# Patient Record
Sex: Male | Born: 1995 | Race: White | Hispanic: No | Smoking: Never smoker
Health system: Southern US, Community
[De-identification: ages and names within clinical notes are randomized; demographics above are authoritative.]

## PROBLEM LIST (undated history)

## (undated) DIAGNOSIS — F909 Attention-deficit hyperactivity disorder, unspecified type: Secondary | ICD-10-CM

## (undated) DIAGNOSIS — K219 Gastro-esophageal reflux disease without esophagitis: Secondary | ICD-10-CM

## (undated) DIAGNOSIS — F840 Autistic disorder: Secondary | ICD-10-CM

## (undated) DIAGNOSIS — F845 Asperger's syndrome: Secondary | ICD-10-CM

## (undated) DIAGNOSIS — M51369 Other intervertebral disc degeneration, lumbar region without mention of lumbar back pain or lower extremity pain: Secondary | ICD-10-CM

## (undated) DIAGNOSIS — F419 Anxiety disorder, unspecified: Secondary | ICD-10-CM

## (undated) DIAGNOSIS — S6292XA Unspecified fracture of left wrist and hand, initial encounter for closed fracture: Secondary | ICD-10-CM

## (undated) DIAGNOSIS — F0781 Postconcussional syndrome: Secondary | ICD-10-CM

## (undated) HISTORY — DX: Gastro-esophageal reflux disease without esophagitis: K21.9

## (undated) HISTORY — DX: Postconcussional syndrome: F07.81

## (undated) HISTORY — DX: Anxiety disorder, unspecified: F41.9

## (undated) HISTORY — DX: Unspecified fracture of left wrist and hand, initial encounter for closed fracture: S62.92XA

## (undated) HISTORY — DX: Autistic disorder: F84.0

## (undated) HISTORY — DX: Attention-deficit hyperactivity disorder, unspecified type: F90.9

## (undated) HISTORY — PX: FRACTURE SURGERY: SHX138

## (undated) HISTORY — DX: Other intervertebral disc degeneration, lumbar region without mention of lumbar back pain or lower extremity pain: M51.369

---
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
## 2017-01-01 ENCOUNTER — Emergency Department (HOSPITAL_BASED_OUTPATIENT_CLINIC_OR_DEPARTMENT_OTHER)
Admission: EM | Admit: 2017-01-01 | Discharge: 2017-01-01 | Disposition: A | Discharge status: Home / Self Care | Payer: Medicaid Other | Attending: Emergency Medicine | Admitting: Emergency Medicine

## 2017-01-01 ENCOUNTER — Encounter (HOSPITAL_BASED_OUTPATIENT_CLINIC_OR_DEPARTMENT_OTHER): Payer: Self-pay | Admitting: Emergency Medicine

## 2017-01-01 DIAGNOSIS — Z5189 Encounter for other specified aftercare: Secondary | ICD-10-CM

## 2017-01-01 DIAGNOSIS — Z4800 Encounter for change or removal of nonsurgical wound dressing: Secondary | ICD-10-CM | POA: Diagnosis not present

## 2017-01-01 DIAGNOSIS — T148XXA Other injury of unspecified body region, initial encounter: Secondary | ICD-10-CM

## 2017-01-01 DIAGNOSIS — Y69 Unspecified misadventure during surgical and medical care: Secondary | ICD-10-CM | POA: Insufficient documentation

## 2017-01-01 DIAGNOSIS — F909 Attention-deficit hyperactivity disorder, unspecified type: Secondary | ICD-10-CM | POA: Diagnosis not present

## 2017-01-01 DIAGNOSIS — T8189XA Other complications of procedures, not elsewhere classified, initial encounter: Secondary | ICD-10-CM | POA: Insufficient documentation

## 2017-01-01 MED ORDER — "THROMBI-PAD 3""X3"" EX PADS"
1.0000 | MEDICATED_PAD | Freq: Once | CUTANEOUS | Status: AC
  Administered 2017-01-01: 1 via TOPICAL
  Filled 2017-01-01: qty 1

## 2017-01-01 NOTE — ED Provider Notes (Signed)
[facility] Provider Note: Michael Spurling, MD, FACEP  CSN: [id number] MRN: [id number] ARRIVAL: 01/01/17 at 2236 ROOM: MH03/MH03  By signing my name below, I, Soijett Blue, attest that this documentation has been prepared under the direction and in the presence of Shanon Rosser, MD. Electronically Signed: Soijett Blue, ED Scribe. 01/01/17. 11:30 PM.  CHIEF COMPLAINT  Wound Check   HISTORY OF PRESENT ILLNESS  Michael Esparza is a 21 y.o. male who presents to the Emergency Department complaining of wound check. He suffered a puncture wound to the medial right knee 2 days ago. He states that he was climbing a tree at his house with spikes when a spike slipped and went approximately 1 inch into his right medial knee.   Mother reports that the pt was seen at [facility] following the incident and prescribed naproxyn and Bactrim. The wound was irrigated but not sutured. Tetanus was updated. He comes to the ED denied because the wound continues to ooze blood despite bandaging. There is moderate tenderness associated with the wound but no erythema or drainage. The right knee joint itself is without pain.   Past Medical History:  Diagnosis Date  . ADHD (attention deficit hyperactivity disorder)   . Asperger's syndrome   . Fracture of left hand 2014   laceration, hedge clipping  . Post concussion syndrome 07/21/2014    Past Surgical History:  Procedure Laterality Date  . OPEN REDUCTION INTERNAL FIXATION (ORIF) HAND Left 12/21/2015   Procedure: OPEN REDUCTION INTERNAL FIXATION (ORIF) HAND, NERVE AND TENDON REPAIR;  Surgeon: Milly Jakob, MD;  Location: [facility];  Service: Orthopedics;  Laterality: Left;    Family History  Problem Relation Age of Onset  . Hypertension Mother   . Arthritis Father 78    anklosing spondylosis    Social History  Substance Use Topics  . Smoking status: Never Smoker  . Smokeless tobacco: Never Used     Comment: dad smokes inside at his house  . Alcohol  use No    Prior to Admission medications   Medication Sig Start Date End Date Taking? Authorizing Provider  ADDERALL XR 25 MG 24 hr capsule Take 2 in mornin g( 50 mg) and one about 3 pm (25 mg) 12/15/16 01/14/17  Roselind Messier, MD  ADDERALL XR 25 MG 24 hr capsule Take 2 cap in morning (50 mg) and one (25 mg ) at 3 pm. 01/15/17 02/14/17  Roselind Messier, MD  ADDERALL XR 25 MG 24 hr capsule Take 2 cap in morning (50 mg) and one (25 mg ) at 3 pm. 02/14/17 03/17/17  Roselind Messier, MD  BENZACLIN gel Apply topically 2 (two) times daily. 12/15/16 05/17/17  Roselind Messier, MD  loratadine (CLARITIN REDITABS) 10 MG dissolvable tablet Take 1 tablet (10 mg total) by mouth daily. As needed for allergy symptoms 12/15/16   Roselind Messier, MD  sodium chloride (OCEAN) 0.65 % SOLN nasal spray Place 1 spray into both nostrils 2 (two) times daily. Patient not taking: Reported on 12/15/2016 12/06/16   Elsie Lincoln, NP    Allergies Patient has no known allergies.   REVIEW OF SYSTEMS  Negative except as noted here or in the History of Present Illness.   PHYSICAL EXAMINATION  Initial Vital Signs Blood pressure 139/85, pulse 80, temperature 98.1 F (36.7 C), temperature source Oral, resp. rate 17, height 5\' 10"  (1.778 m), weight 177 lb (80.3 kg), SpO2 100 %.  Examination General: Well-developed, well-nourished male in no acute distress; appearance consistent with  age of record HENT: normocephalic; atraumatic Eyes: pupils equal, round and reactive to light; extraocular muscles intact Neck: supple Heart: regular rate and rhythm Lungs: clear to auscultation bilaterally Abdomen: soft; nondistended; nontender; no masses or hepatosplenomegaly; bowel sounds present Extremities: No deformity; full range of motion; pulses normal; puncture wound to medial right knee with oozing of blood but no surrounding erythema or purulent drainage; no tenderness, erythema, warmth or swelling of right knee joint;  surgical changes to left thumb Neurologic: Awake, alert and oriented; motor function intact in all extremities and symmetric; no facial droop Skin: Warm and dry Psychiatric: Normal mood and affect   RESULTS  Summary of this visit's results, reviewed by myself:   EKG Interpretation  Date/Time:    Ventricular Rate:    PR Interval:    QRS Duration:   QT Interval:    QTC Calculation:   R Axis:     Text Interpretation:        Laboratory Studies: No results found for this or any previous visit (from the past 24 hour(s)). Imaging Studies: No results found.  ED COURSE  Nursing notes and initial vitals signs, including pulse oximetry, reviewed.  Vitals:   01/01/17 2248 01/01/17 2249  BP: 139/85   Pulse: 80   Resp: 17   Temp: 98.1 F (36.7 C)   TempSrc: Oral   SpO2: 100%   Weight:  177 lb (80.3 kg)  Height:  5\' 10"  (1.778 m)   Will treat with topical thrombin and pressure dressing. Patient advised to keep leg elevated.  PROCEDURES    ED DIAGNOSES     ICD-9-CM ICD-10-CM   1. Encounter for wound re-check V58.89 Z51.89   2. Bleeding from wound 958.2 T14.8XXA     I personally performed the services described in this documentation, which was scribed in my presence. The recorded information has been reviewed and is accurate.     Shanon Rosser, MD 01/01/17 [id number]

## 2017-01-01 NOTE — ED Triage Notes (Signed)
PT presents to ED with c/o of right leg wound check. PT stst he was climbing a tree Friday and one of the spikes slipped an stuck him. Pt went to an urgent care but they did not suture it and pt sts it continues to bleed. Pressure applied in triage.

## 2017-01-30 IMAGING — CR DG HAND COMPLETE 3+V*L*
3 series · 3 of 3 positions shown · non-contrast
Comparison: 06/07/2012.

CLINICAL DATA: Open wound of the left thumb with deformity noted
falling injury today. Initial encounter.

EXAM:
LEFT HAND - COMPLETE 3+ VIEW

[hand pa]
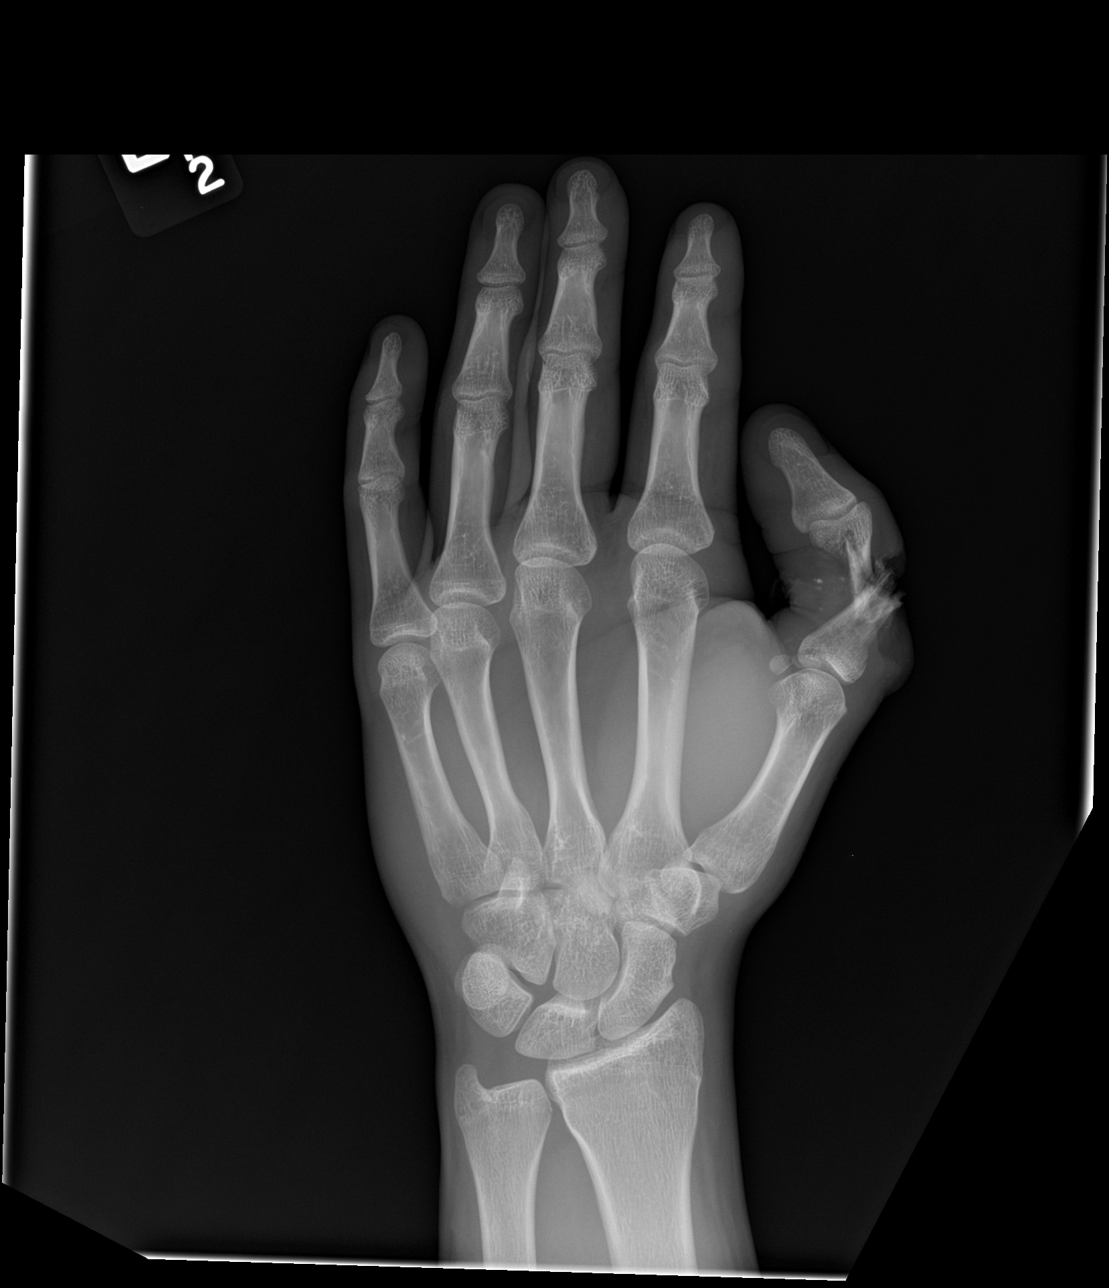

[hand obl]
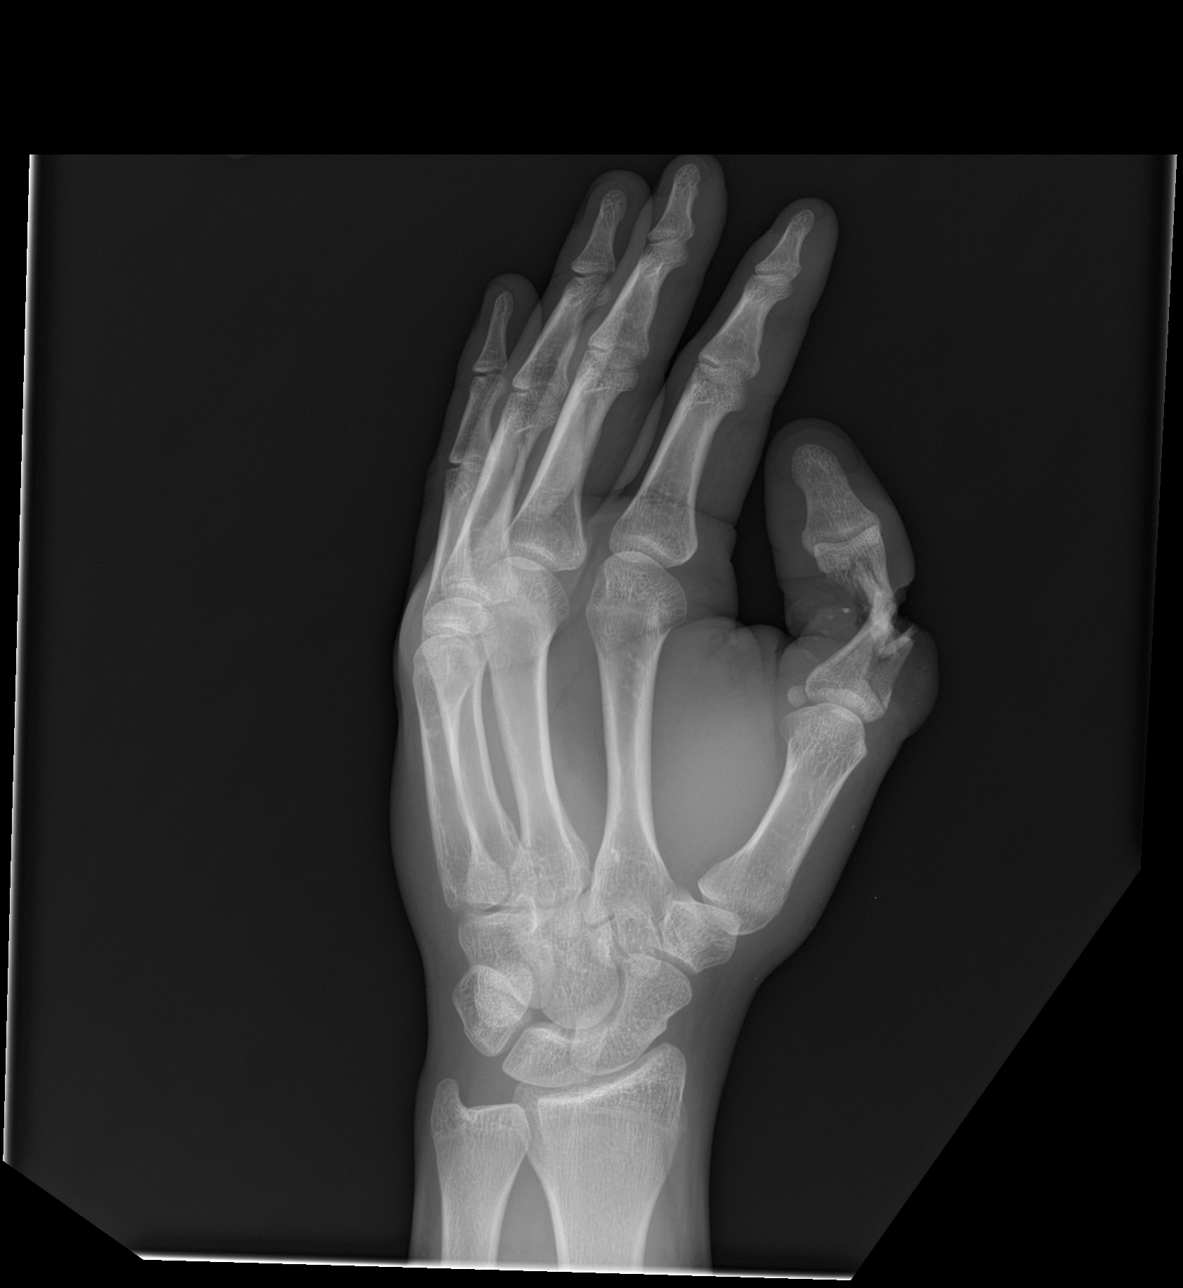

[hand lat]
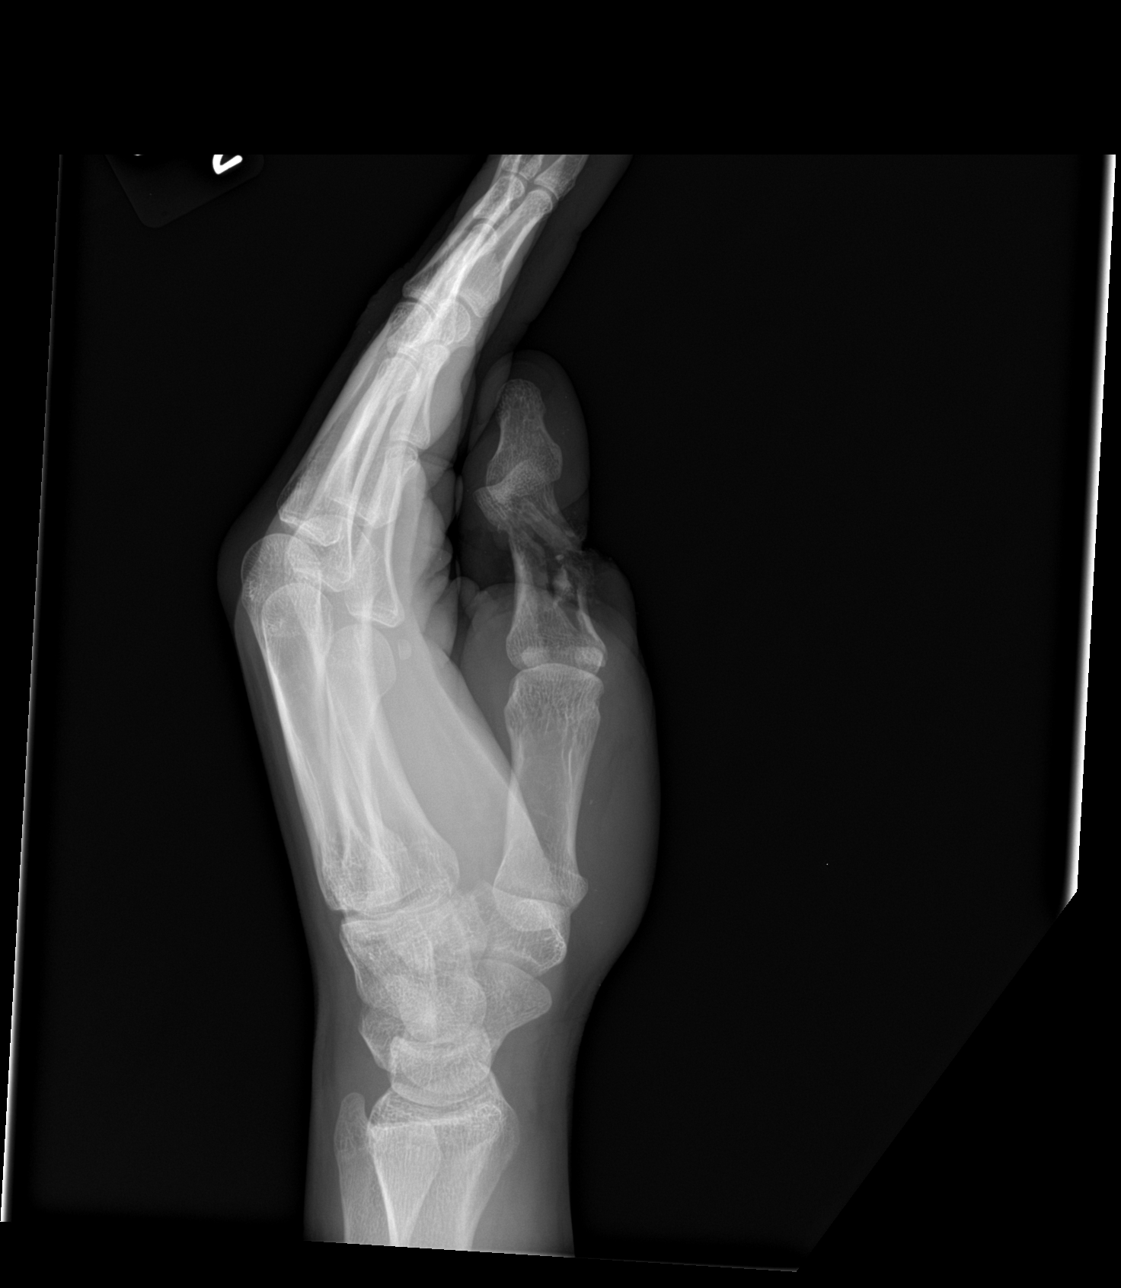

[3 of 3 positions shown; findings below may reference images not displayed]

FINDINGS: There is an acute, extensively comminuted and open fracture of the
proximal phalanx of the left thumb. This fracture is displaced and
angulated, but shows no definite extension to the metacarpal
phalangeal or interphalangeal joints. There are probable associated
foreign bodies within the surrounding soft tissues. No other acute
osseous findings are seen.
IMPRESSION: Extensively comminuted, open and displaced fracture of the left
first proximal phalanx.

## 2017-01-31 IMAGING — RF DG FINGER THUMB 2+V*L*
1 series · 2 of 2 positions shown · non-contrast
Comparison: 12/21/2015

CLINICAL DATA: Left thumb ORIF.

EXAM:
DG C-ARM 61-120 MIN; LEFT THUMB 2+V

[Series 1: run · 2 of 2 slices shown]
[im 1/2]
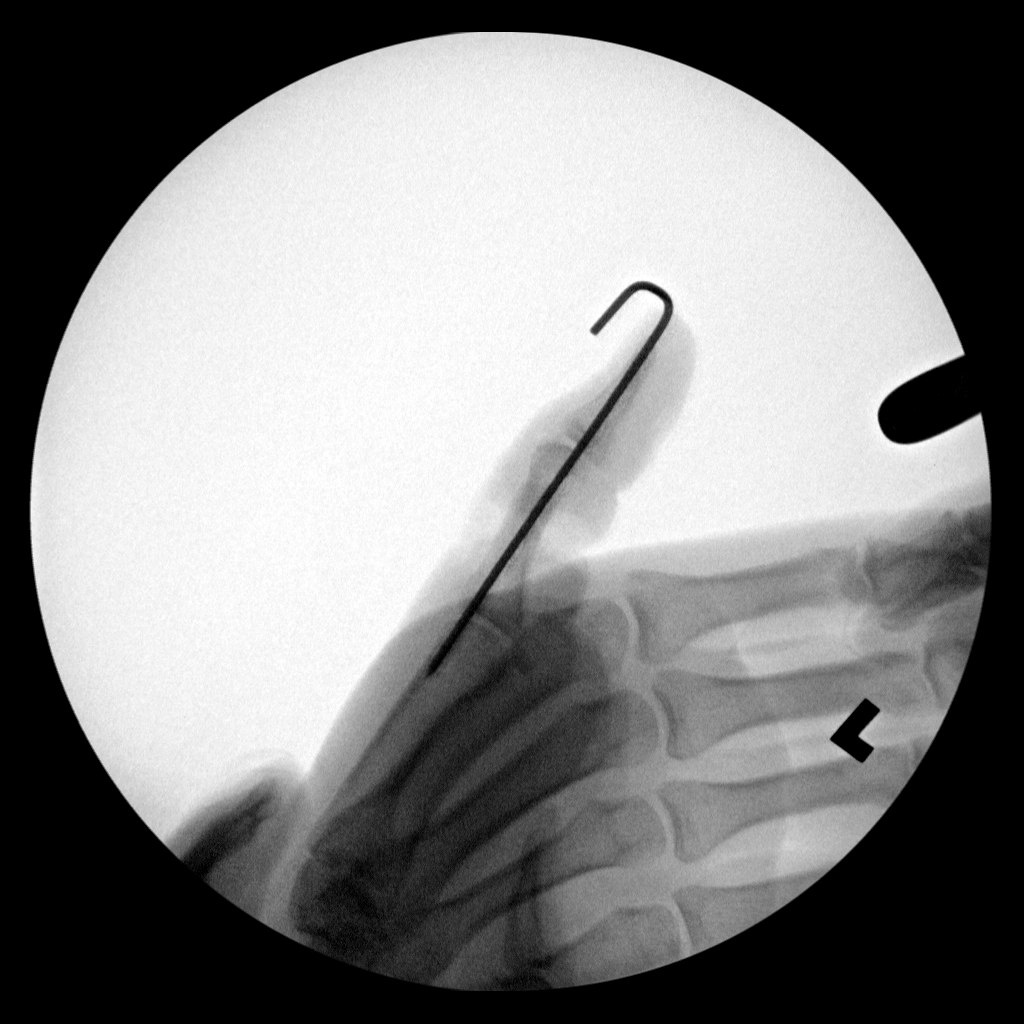
[im 2/2]
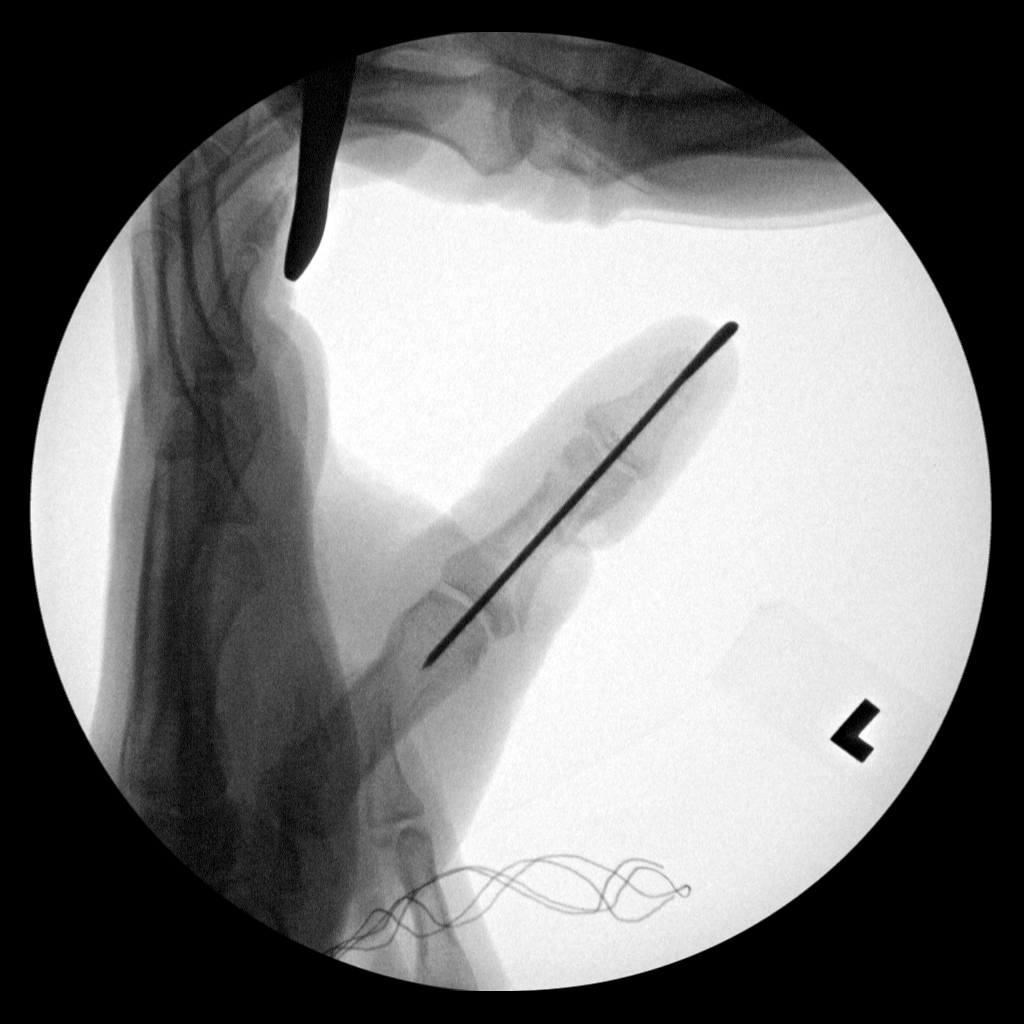

[2 of 2 positions shown; findings below may reference images not displayed]

FINDINGS: Intraoperative fluoroscopy is obtained for surgical control
purposes. Fluoroscopy time is recorded at 16 seconds. Two spot
fluoroscopic images are obtained.

Spot fluoroscopic images obtained demonstrate interval reduction and
K-wire fixation of comminuted and displaced fractures of the
proximal phalanx of the left first finger seen on prior study.
Fracture fragments demonstrate near-anatomic alignment and position.
IMPRESSION: Intraoperative fluoroscopy obtained for surgical control purposes,
demonstrating internal reduction and K-wire fixation of fractures of
the proximal phalanx of the left first finger.

## 2017-03-16 ENCOUNTER — Ambulatory Visit (INDEPENDENT_AMBULATORY_CARE_PROVIDER_SITE_OTHER): Payer: Medicaid Other | Admitting: Pediatrics

## 2017-03-16 ENCOUNTER — Encounter: Payer: Self-pay | Admitting: Pediatrics

## 2017-03-16 VITALS — BP 120/74 | HR 64 | Ht 69.29 in | Wt 177.6 lb

## 2017-03-16 DIAGNOSIS — F902 Attention-deficit hyperactivity disorder, combined type: Secondary | ICD-10-CM | POA: Diagnosis not present

## 2017-03-16 DIAGNOSIS — J301 Allergic rhinitis due to pollen: Secondary | ICD-10-CM

## 2017-03-16 MED ORDER — FLUTICASONE PROPIONATE 50 MCG/ACT NA SUSP: 1.0000 | Freq: Every day | NASAL | 5 refills | Status: DC

## 2017-03-16 MED ORDER — ADDERALL XR 25 MG PO CP24: ORAL_CAPSULE | ORAL | 0 refills | Status: DC

## 2017-03-16 MED ORDER — CETIRIZINE HCL 10 MG PO TABS: 10.0000 mg | ORAL_TABLET | Freq: Every day | ORAL | 5 refills | Status: DC

## 2017-03-16 MED ORDER — MONTELUKAST SODIUM 10 MG PO TABS: 10.0000 mg | ORAL_TABLET | Freq: Every day | ORAL | 5 refills | Status: DC

## 2017-03-16 NOTE — Patient Instructions (Signed)
Flonase works best if used every day  Please try Cetirizine instead of Claritin  Please avoid further injuries this summer!  Websites for Teens  General www.youngwomenshealth.org www.youngmenshealthsite.org www.teenhealthfx.com www.teenhealth.org www.healthychildren.org  Sexual and Reproductive Health www.bedsider.org www.seventeendays.org www.plannedparenthood.org www.https://www.marshall.com/ www.girlology.com  Relaxation & Meditation Apps for Teens Mindshift StopBreatheThink Relax & Rest Smiling Mind Calm Headspace Take A Chill Kids Feeling SAM Freshmind Yoga [employer]  Websites for kids with ADHD and their families www.smartkidswithld.org www.additudemag.com  Apps for Parents of [place]

## 2017-03-16 NOTE — Progress Notes (Signed)
Subjective:     Michael Esparza, is a 21 y.o. male  HPI  Chief Complaint  Patient presents with  . ADHD  . 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 They are cauticious about loose pill Adderall xr 25 mg 2 in am and 1 about 3 pm,   ROS/ Side effects? No change in appetite, No change in sleep No Headache or dizziness No chest pain or palpitations No nausea, vomiting or abd pain No change in mood, no elation, no wearing off dysphoria   Review of Systems  Going to beach today   The following portions of the patient's history were reviewed and updated as appropriate: allergies, current medications, past family history, past medical history, past surgical history and problem list.     Objective:     Blood pressure 120/74, pulse 64, height 5' 9.29" (1.76 m), weight 177 lb 9.6 oz (80.6 kg).  Physical Exam  Constitutional: He appears well-developed and well-nourished. No distress.  HENT:  Head: Normocephalic and atraumatic.  Nose: Nose normal.  Mouth/Throat: Oropharynx is clear and moist.  Eyes: Conjunctivae and EOM are normal. Right eye exhibits no discharge. Left eye exhibits no discharge.  Neck: Normal range of motion. No thyromegaly present.  Cardiovascular: Normal rate, regular rhythm and normal heart sounds.   No murmur heard. Pulmonary/Chest: No respiratory distress. He has no wheezes. He has no rales.  Abdominal: Soft. He exhibits no distension. There is no tenderness.    Lymphadenopathy:    He has no cervical adenopathy.  Skin: Skin is warm and dry. No rash noted.       Assessment & Plan:    Add singulair, failure to resolve sym after 2 month of Claritin 1. Seasonal allergic rhinitis due to pollen Poor control, inadequestely treated - fluticasone (FLONASE) 50 MCG/ACT nasal spray; Place 1 spray into both nostrils daily. 1 spray in each nostril every day  Dispense: 16 g; Refill: 5 - cetirizine (ZYRTEC) 10 MG tablet; Take 1 tablet (10 mg total) by mouth daily.  Dispense: 30 tablet; Refill: 5 - montelukast (SINGULAIR) 10 MG tablet; Take 1 tablet (10 mg total) by mouth at bedtime.  Dispense: 30 tablet; Refill: 5  Add singulair do to lask of response for 2 months of claritin  2. Attention deficit hyperactivity disorder (ADHD), combined type  Doing well on current dose No side effects noted - ADDERALL XR 25 MG 24 hr capsule; Take 2 cap in morning (50 mg) and one (25 mg ) at 3 pm.  Dispense: 90 capsule; Refill: 0 - ADDERALL XR 25 MG 24 hr capsule; Take 2 in mornin g( 50 mg) and one about 3 pm (25 mg)  Dispense: 90 capsule; Refill: 0 - ADDERALL XR 25 MG 24 hr capsule; Take 2 cap in morning (50 mg) and one (25 mg ) at 3 pm.  Dispense: 90 capsule; Refill: 0  Supportive care and return precautions reviewed.  Spent  25  minutes  face to face time with patient; greater than 50% spent in counseling regarding diagnosis and treatment plan.   Roselind Messier, MD

## 2017-04-06 ENCOUNTER — Telehealth: Payer: Self-pay | Admitting: *Deleted

## 2017-04-06 NOTE — Telephone Encounter (Signed)
Mom called back and she has the prescription. [facility] as the brand name.

## 2017-04-06 NOTE — Telephone Encounter (Signed)
Received Rejection Message from pharmacy regarding adderall XR stating QTY exceeds the adult dosage by the FDA for ADD/ADHD meds.  Called the insurer [employer] for approval and was told that the pharmacy submitted request for generic when brand name is preferred and pharmacy should resubmit.  Per Help Desk [phone]) the quantity could be overridden.    [facility] and [facility] stated patient must have taken written Rx with him because they could not process it.  Called mom's number and left a message on generic voicemail to call the office.

## 2017-06-15 ENCOUNTER — Encounter (HOSPITAL_BASED_OUTPATIENT_CLINIC_OR_DEPARTMENT_OTHER): Payer: Self-pay

## 2017-06-15 ENCOUNTER — Emergency Department (HOSPITAL_BASED_OUTPATIENT_CLINIC_OR_DEPARTMENT_OTHER)
Admission: EM | Admit: 2017-06-15 | Discharge: 2017-06-15 | Disposition: A | Discharge status: Home / Self Care | Payer: Medicaid Other | Attending: Emergency Medicine | Admitting: Emergency Medicine

## 2017-06-15 ENCOUNTER — Emergency Department (HOSPITAL_BASED_OUTPATIENT_CLINIC_OR_DEPARTMENT_OTHER): Payer: Medicaid Other

## 2017-06-15 ENCOUNTER — Ambulatory Visit: Payer: Medicaid Other | Admitting: Pediatrics

## 2017-06-15 DIAGNOSIS — Y939 Activity, unspecified: Secondary | ICD-10-CM | POA: Diagnosis not present

## 2017-06-15 DIAGNOSIS — Y929 Unspecified place or not applicable: Secondary | ICD-10-CM | POA: Diagnosis not present

## 2017-06-15 DIAGNOSIS — F845 Asperger's syndrome: Secondary | ICD-10-CM | POA: Insufficient documentation

## 2017-06-15 DIAGNOSIS — S61412A Laceration without foreign body of left hand, initial encounter: Secondary | ICD-10-CM | POA: Insufficient documentation

## 2017-06-15 DIAGNOSIS — W14XXXA Fall from tree, initial encounter: Secondary | ICD-10-CM | POA: Diagnosis not present

## 2017-06-15 DIAGNOSIS — W19XXXA Unspecified fall, initial encounter: Secondary | ICD-10-CM

## 2017-06-15 DIAGNOSIS — Y999 Unspecified external cause status: Secondary | ICD-10-CM | POA: Insufficient documentation

## 2017-06-15 DIAGNOSIS — Z79899 Other long term (current) drug therapy: Secondary | ICD-10-CM | POA: Diagnosis not present

## 2017-06-15 DIAGNOSIS — S6992XA Unspecified injury of left wrist, hand and finger(s), initial encounter: Secondary | ICD-10-CM | POA: Diagnosis present

## 2017-06-15 MED ORDER — LIDOCAINE HCL (PF) 1 % IJ SOLN: 5.0000 mL | Freq: Once | INTRAMUSCULAR | Status: DC

## 2017-06-15 MED ORDER — CEPHALEXIN 500 MG PO CAPS: 500.0000 mg | ORAL_CAPSULE | Freq: Two times a day (BID) | ORAL | 0 refills | Status: AC

## 2017-06-15 MED ORDER — ACETAMINOPHEN 500 MG PO TABS
1000.0000 mg | ORAL_TABLET | Freq: Once | ORAL | Status: AC
  Administered 2017-06-15: 1000 mg via ORAL
  Filled 2017-06-15: qty 2

## 2017-06-15 MED ORDER — LIDOCAINE HCL 1 % IJ SOLN
INTRAMUSCULAR | Status: AC
  Administered 2017-06-15: 10 mL
  Filled 2017-06-15: qty 10

## 2017-06-15 NOTE — ED Notes (Signed)
Patient transported to X-ray 

## 2017-06-15 NOTE — ED Triage Notes (Signed)
Pt states he was in a top of tree-piece snapped and he fell through the tree-was strapped in-multiple abrasions-lac to left hand-pain to left LE-NAD-steady gait

## 2017-06-15 NOTE — ED Notes (Signed)
Nonadhereant dressing applied to wound, wrapped in Kerlix and secured with Coban

## 2017-06-15 NOTE — ED Provider Notes (Signed)
[facility] Provider Note   CSN: [id number] Arrival date & time: 06/15/17  1656     History   Chief Complaint Chief Complaint  Patient presents with  . Trauma    HPI Michael Esparza is a 21 y.o. male.  HPI   Presents with concern for laceration to left hand after falling while cutting down branches off of a tree. Was sitting in harness 52ft off the ground in a maple tree when the top broke and he feel 26ft through the branches before his harness caught him> He lowered himself down to the ground, able to ambulate at the scene. Mild headache, it happened so quickly not sure if hit head ornot. No LOC.   Saw was on top of hand, had was between branch, thinks saw hit it and then hand stuck.  No chest pain, no abdominal pain, no nausea or vomiting. Mild lightheadedness on way here. No neck or back pain. UTD on tetanus  Past Medical History:  Diagnosis Date  . ADHD (attention deficit hyperactivity disorder)   . Asperger's syndrome   . Fracture of left hand 2014   laceration, hedge clipping  . Post concussion syndrome 07/21/2014    Patient Active Problem List   Diagnosis Date Noted  . Seasonal allergic rhinitis due to pollen 03/16/2017  . Overweight, pediatric, BMI 85.0-94.9 percentile for age 75/22/2017  . Thumb fracture 12/22/2015  . Acne 07/23/2015  . Influenza vaccination declined 07/11/2014  . Attention deficit hyperactivity disorder (ADHD), combined type 06/12/2014  . Asperger syndrome 06/12/2014    Past Surgical History:  Procedure Laterality Date  . OPEN REDUCTION INTERNAL FIXATION (ORIF) HAND Left 12/21/2015   Procedure: OPEN REDUCTION INTERNAL FIXATION (ORIF) HAND, NERVE AND TENDON REPAIR;  Surgeon: Milly Jakob, MD;  Location: [facility];  Service: Orthopedics;  Laterality: Left;       Home Medications    Prior to Admission medications   Medication Sig Start Date End Date Taking? Authorizing Provider  ADDERALL XR 25 MG 24 hr capsule Take 2 cap in  morning (50 mg) and one (25 mg ) at 3 pm. 03/16/17 04/16/17  Roselind Messier, MD  ADDERALL XR 25 MG 24 hr capsule Take 2 in mornin g( 50 mg) and one about 3 pm (25 mg) 04/15/17 05/16/17  Roselind Messier, MD  ADDERALL XR 25 MG 24 hr capsule Take 2 cap in morning (50 mg) and one (25 mg ) at 3 pm. 05/16/17 06/16/17  Roselind Messier, MD  cephALEXin (KEFLEX) 500 MG capsule Take 1 capsule (500 mg total) by mouth 2 (two) times daily. 06/15/17 06/22/17  Gareth Morgan, MD  cetirizine (ZYRTEC) 10 MG tablet Take 1 tablet (10 mg total) by mouth daily. 03/16/17   Roselind Messier, MD  fluticasone (FLONASE) 50 MCG/ACT nasal spray Place 1 spray into both nostrils daily. 1 spray in each nostril every day 03/16/17   Roselind Messier, MD  montelukast (SINGULAIR) 10 MG tablet Take 1 tablet (10 mg total) by mouth at bedtime. 03/16/17   Roselind Messier, MD    Family History Family History  Problem Relation Age of Onset  . Hypertension Mother   . Arthritis Father 35       anklosing spondylosis    Social History Social History  Substance Use Topics  . Smoking status: Never Smoker  . Smokeless tobacco: Never Used  . Alcohol use No     Allergies   Patient has no known allergies.   Review of Systems Review of Systems  Constitutional: Negative for fever.  HENT: Negative for sore throat.   Eyes: Negative for visual disturbance.  Respiratory: Negative for shortness of breath.   Cardiovascular: Negative for chest pain.  Gastrointestinal: Negative for abdominal pain, nausea and vomiting.  Genitourinary: Negative for difficulty urinating.  Musculoskeletal: Positive for arthralgias. Negative for back pain and neck stiffness.  Skin: Positive for wound. Negative for rash.  Neurological: Negative for syncope and headaches.     Physical Exam Updated Vital Signs BP 134/84 (BP Location: Left Arm)   Pulse 95   Temp 98.9 F (37.2 C) (Oral)   Resp 18   Ht 5\' 9"  (1.753 m)   Wt 84.3 kg (185 lb 13.6 oz)    SpO2 98%   BMI 27.44 kg/m   Physical Exam  Constitutional: He is oriented to person, place, and time. He appears well-developed and well-nourished. No distress.  HENT:  Head: Normocephalic and atraumatic.  Eyes: Conjunctivae and EOM are normal.  Neck: Normal range of motion.  Cardiovascular: Normal rate, regular rhythm, normal heart sounds and intact distal pulses.  Exam reveals no gallop and no friction rub.   No murmur heard. Pulmonary/Chest: Effort normal and breath sounds normal. No respiratory distress. He has no wheezes. He has no rales.  Abdominal: Soft. He exhibits no distension. There is no tenderness. There is no guarding.  Musculoskeletal: He exhibits no edema.  Full ROM of fingers, normal cap refill  Neurological: He is alert and oriented to person, place, and time.  Skin: Skin is warm and dry. He is not diaphoretic.  Multiple abrasions Laceration dorsum of left hand MCP base of ring finger 3cm  Nursing note and vitals reviewed.    ED Treatments / Results  Labs (all labs ordered are listed, but only abnormal results are displayed) Labs Reviewed - No data to display  EKG  EKG Interpretation None       Radiology No results found.  Procedures .Marland KitchenLaceration Repair Date/Time: 06/18/2017 12:52 AM Performed by: Gareth Morgan Authorized by: Gareth Morgan   Consent:    Consent obtained:  Verbal   Consent given by:  Patient   Risks discussed:  Infection, pain and poor wound healing   Alternatives discussed:  No treatment Laceration details:    Location:  Hand   Hand location:  L hand, dorsum   Length (cm):  2 Repair type:    Repair type:  Simple Pre-procedure details:    Preparation:  Imaging obtained to evaluate for foreign bodies and patient was prepped and draped in usual sterile fashion Treatment:    Area cleansed with:  Saline   Amount of cleaning:  Standard   Irrigation solution:  Sterile saline   Irrigation method:  Syringe Skin repair:     Repair method:  Sutures   Suture size:  4-0   Suture material:  Prolene   Suture technique:  Simple interrupted   Number of sutures:  4 Post-procedure details:    Dressing:  Antibiotic ointment, non-adherent dressing and splint for protection   Patient tolerance of procedure:  Tolerated well, no immediate complications   (including critical care time)  Medications Ordered in ED Medications  acetaminophen (TYLENOL) tablet 1,000 mg (1,000 mg Oral Given 06/15/17 1752)  lidocaine (XYLOCAINE) 1 % (with pres) injection (10 mLs  Given by Other 06/15/17 1910)     Initial Impression / Assessment and Plan / ED Course  I have reviewed the triage vital signs and the nursing notes.  Pertinent labs & imaging results  that were available during my care of the patient were reviewed by me and considered in my medical decision making (see chart for details).     21 year old male presents with concern for left hand laceration after he fell in a tree while cutting branches. Patient was in harness and fell approximately 10 feet through the branches before the harness caught his fall. He denies any headache, has no signs of skull fracture on exam, no nausea vomiting, have low suspicion for intracranial injury. His cervical spine is cleared by Nexus criteria. Doubt other acute injuries by hx and physical. He is up-to-date on tetanus. He has a laceration, is neurovascularly intact, and has full range of motion of his finger. Laceration irrigated, sutures placed. Recommend removal in 10 days. Given keflex for wound infxn prophylaxis. Patient discharged in stable condition with understanding of reasons to return.    Final Clinical Impressions(s) / ED Diagnoses   Final diagnoses:  Fall, initial encounter  Laceration of left hand without foreign body, initial encounter    New Prescriptions Discharge Medication List as of 06/15/2017  7:20 PM    START taking these medications   Details  cephALEXin (KEFLEX) 500  MG capsule Take 1 capsule (500 mg total) by mouth 2 (two) times daily., Starting Thu 06/15/2017, Until Thu 06/22/2017, Print         Gareth Morgan, MD 06/18/17 [id number]

## 2017-07-07 ENCOUNTER — Ambulatory Visit (INDEPENDENT_AMBULATORY_CARE_PROVIDER_SITE_OTHER): Payer: Medicaid Other | Admitting: Pediatrics

## 2017-07-07 ENCOUNTER — Encounter: Payer: Self-pay | Admitting: Pediatrics

## 2017-07-07 VITALS — BP 125/78 | HR 66 | Ht 69.25 in | Wt 179.0 lb

## 2017-07-07 DIAGNOSIS — F902 Attention-deficit hyperactivity disorder, combined type: Secondary | ICD-10-CM | POA: Diagnosis not present

## 2017-07-07 DIAGNOSIS — Z2821 Immunization not carried out because of patient refusal: Secondary | ICD-10-CM

## 2017-07-07 MED ORDER — ADDERALL XR 25 MG PO CP24: ORAL_CAPSULE | ORAL | 0 refills | Status: DC

## 2017-07-07 MED ORDER — ADDERALL XR 25 MG PO CP24
ORAL_CAPSULE | ORAL | 0 refills | Status: DC
Start: 2017-07-07 — End: 2017-10-10

## 2017-07-07 NOTE — Progress Notes (Signed)
   Subjective:     Michael Esparza, is a 21 y.o. male  HPI  Chief Complaint  Patient presents with  . Follow-up    adhd   06/15/17: ED visit for hand laceration after fell from tree and cut hand on saw was in a harness, failure of communication of crew. Not usual crew, only 4 stitches, no harm ot previous thumb injury  Working all the time, mostly clearing stumps,  Ear plugs and head phone,   Scratches of forearm, pine tree yesterday  Meds: usually wears off about 4- 4:30,  Sometimes mom brings it to the job Stable dose for years   ROS/ Side effects? No change in appetite, No change in sleep No Headache or dizziness No chest pain or palpitations No nausea, vomiting or abd pain No change in mood, no elation, no wearing off dysphoria  Substance abuse: no Mood instability: neutral Tics: no Disruptive behaviors: no Learning difficulties: yes, graduated Anxiety: no  Review of Systems  Never gotten flu shot, mom is allowing patient to make the decision  [name] would like to continue in this clinic for as long as possible   The following portions of the patient's history were reviewed and updated as appropriate: allergies, current medications, past family history, past medical history, past social history, past surgical history and problem list.     Objective:     Blood pressure 125/78, pulse 66, height 5' 9.25" (1.759 m), weight 179 lb (81.2 kg).   Physical Exam  Constitutional: He appears well-developed and well-nourished. No distress.  HENT:  Head: Normocephalic and atraumatic.  Nose: Nose normal.  Mouth/Throat: Oropharynx is clear and moist.  Eyes: Conjunctivae are normal. Right eye exhibits no discharge. Left eye exhibits no discharge.  Neck: Normal range of motion. No thyromegaly present.  Cardiovascular: Normal rate, regular rhythm and normal heart sounds.   No murmur heard. Pulmonary/Chest: No respiratory distress. He has no wheezes. He has no rales.    Abdominal: Soft. He exhibits no distension. There is no tenderness.  Lymphadenopathy:    He has no cervical adenopathy.  Skin: Skin is warm and dry. Rash noted.  Healing linear laceration -2 onleft hand, Bilateral forearms linear abrasions, without signs of infections       Assessment & Plan:   1. Attention deficit hyperactivity disorder (ADHD), combined type  Stable on dose, no side effects, Reviewed periodic screening needed, review how to read prescription for accuratcy with patient. discussed transition to adult care at 23  - ADDERALL XR 25 MG 24 hr capsule; Take 2 cap in morning (50 mg) and one (25 mg ) at 3 pm.  Dispense: 90 capsule; Refill: 0 - ADDERALL XR 25 MG 24 hr capsule; Take 2 in mornin g( 50 mg) and one about 3 pm (25 mg)  Dispense: 90 capsule; Refill: 0 - ADDERALL XR 25 MG 24 hr capsule; Take 2 cap in morning (50 mg) and one (25 mg ) at 3 pm.  Dispense: 90 capsule; Refill: 0  2. Influenza vaccination declined by patient He will read more and make a decision on his own.    Supportive care and return precautions reviewed.  Spent  15  minutes face to face time with patient; greater than 50% spent in counseling regarding diagnosis and treatment plan.   Roselind Messier, MD

## 2017-07-10 ENCOUNTER — Telehealth: Payer: Self-pay | Admitting: Pediatrics

## 2017-07-10 NOTE — Telephone Encounter (Signed)
Mom called stating that the patient needs PA for the ADDERALL XR 25 MG 24 hr capsule in order to be able to [facility] [address]. Please call her when it is ready at [phone]

## 2017-07-11 NOTE — Telephone Encounter (Signed)
I spoke with [facility]: Michael Esparza's RX was written in the appropriate manner for Medicaid coverage but Michael Esparza dose requires that the pharmacy enter "behavioral health edit" and "high dose override"; pharmacist tried these codes but RX will still not process, so he will call [facility] to rectify. I called Michael Esparza and explained the situation; she is frustrated but appreciates assistance and information; she will call [facility] if RX is not done by this afternoon so we can help advocate for prompt pharmacy service; Michael Esparza has been out of medication since 07/08/17.

## 2017-07-11 NOTE — Telephone Encounter (Signed)
Mom called to inform us that pharmacy was able to get RX to process. Alejandra has his medication.

## 2017-10-10 ENCOUNTER — Encounter: Payer: Self-pay | Admitting: Pediatrics

## 2017-10-10 ENCOUNTER — Ambulatory Visit (INDEPENDENT_AMBULATORY_CARE_PROVIDER_SITE_OTHER): Payer: Medicaid Other | Admitting: Pediatrics

## 2017-10-10 DIAGNOSIS — F902 Attention-deficit hyperactivity disorder, combined type: Secondary | ICD-10-CM

## 2017-10-10 MED ORDER — ADDERALL XR 25 MG PO CP24: ORAL_CAPSULE | ORAL | 0 refills | Status: DC

## 2017-10-10 NOTE — Patient Instructions (Signed)
Good to see you today!. Thank you for coming in.   The best website for information about children is Dividend[employer].pl.  All the information is reliable and up-to-date.    Call the main number [phone] before going to the Emergency Department unless it's a true emergency.  For a true emergency, go to the [facility] Emergency Department.   When the clinic is closed, a nurse always answers the main number [phone] and a doctor is always available.    Clinic is open for sick visits only on Saturday mornings from 8:30AM to 12:30PM. Call first thing on Saturday morning for an appointment.

## 2017-10-10 NOTE — Progress Notes (Signed)
Subjective:     Michael Esparza, is a 22 y.o. male  HPI  Chief Complaint  Patient presents with  . Follow-up    ADHD   Leaves at 7 am and gets hope late and  Night,  Does tree removal, been lots of storms lately,   Stable dose for years  2 in the morning and one in afternoon He can tell when is wearing off Mom brings med to his job  Also works at [employer] weekends He doesn't want to carry it around.  To go to [facility] in [place]? No change in appetite, has weight that changes with season and amount that he is workings, has good appetite No change in sleep No Headache or dizziness No chest pain or palpitations No nausea, vomiting or abd pain No change in mood, no elation, no wearing off dysphoria  Substance abuse: no Mood instability: happy Tics: no Disruptive behaviors: no Learning difficulties: yes Anxiety: no  Other:  06/2017: fall and laceration: fell from tree and cut hand on saw 03/2017 seasonal allergies  Plans to move to adult doctor at end of year   Review of Systems  Is using acne meds every other day, to avoid dryness. Satisfied with results   The following portions of the patient's history were reviewed and updated as appropriate: allergies, current medications, past family history, past medical history, past social history, past surgical history and problem list.     Objective:     Blood pressure 121/78, pulse 78, height 5' 9.29" (1.76 m), weight 168 lb 12.8 oz (76.6 kg).  Physical Exam  Constitutional: He is oriented to person, place, and time. He appears well-developed and well-nourished. No distress.  HENT:  Mouth/Throat: Oropharynx is clear and moist.  Eyes: Conjunctivae are normal.  Cardiovascular: Normal rate, regular rhythm and normal heart sounds.  No murmur heard. Pulmonary/Chest: Effort normal and breath sounds normal.  Abdominal: Soft. Bowel sounds are normal.  No HSM  Musculoskeletal: Normal range of  motion.  Lymphadenopathy:    He has no cervical adenopathy.  Neurological: He is alert and oriented to person, place, and time. He exhibits normal muscle tone. Coordination normal.  Skin:  Only occasional papule on face, mostly smooth with no hyperpigmentation  Psychiatric: He has a normal mood and affect.       Assessment & Plan:   1. Attention deficit hyperactivity disorder (ADHD), combined type  Doing well on stable dose. No change in prescriptions Has some weight loss, weight variation up  To 15 points.  Often has season weight variation based on how much he works, mo attibutes current weight loss to lots of work rather than appetite change He was overweight for a while, so this is a healthy weight, he is also muscular which can suggest a higher BMI  - ADDERALL XR 25 MG 24 hr capsule; Take 2 cap in morning (50 mg) and one (25 mg ) at 3 pm.  Dispense: 90 capsule; Refill: 0 - ADDERALL XR 25 MG 24 hr capsule; Take 2 in mornin g( 50 mg) and one about 3 pm (25 mg)  Dispense: 90 capsule; Refill: 0 - ADDERALL XR 25 MG 24 hr capsule; Take 2 cap in morning (50 mg) and one (25 mg ) at 3 pm.  Dispense: 90 capsule; Refill: 0  Due for adult well care after 12/08/16  Supportive care and return precautions reviewed.  Spent  25  minutes face to face time with patient; greater  than 50% spent in counseling regarding diagnosis and treatment plan.   Roselind Messier, MD

## 2017-10-18 ENCOUNTER — Emergency Department (HOSPITAL_BASED_OUTPATIENT_CLINIC_OR_DEPARTMENT_OTHER): Payer: Medicaid Other

## 2017-10-18 ENCOUNTER — Encounter (HOSPITAL_BASED_OUTPATIENT_CLINIC_OR_DEPARTMENT_OTHER): Payer: Self-pay

## 2017-10-18 ENCOUNTER — Other Ambulatory Visit: Payer: Self-pay

## 2017-10-18 ENCOUNTER — Emergency Department (HOSPITAL_BASED_OUTPATIENT_CLINIC_OR_DEPARTMENT_OTHER)
Admission: EM | Admit: 2017-10-18 | Discharge: 2017-10-18 | Disposition: A | Discharge status: Home / Self Care | Payer: Medicaid Other | Attending: Emergency Medicine | Admitting: Emergency Medicine

## 2017-10-18 DIAGNOSIS — Y999 Unspecified external cause status: Secondary | ICD-10-CM | POA: Diagnosis not present

## 2017-10-18 DIAGNOSIS — Z79899 Other long term (current) drug therapy: Secondary | ICD-10-CM | POA: Insufficient documentation

## 2017-10-18 DIAGNOSIS — Y9389 Activity, other specified: Secondary | ICD-10-CM | POA: Insufficient documentation

## 2017-10-18 DIAGNOSIS — F902 Attention-deficit hyperactivity disorder, combined type: Secondary | ICD-10-CM | POA: Insufficient documentation

## 2017-10-18 DIAGNOSIS — W268XXA Contact with other sharp object(s), not elsewhere classified, initial encounter: Secondary | ICD-10-CM | POA: Diagnosis not present

## 2017-10-18 DIAGNOSIS — Y929 Unspecified place or not applicable: Secondary | ICD-10-CM | POA: Insufficient documentation

## 2017-10-18 DIAGNOSIS — S61211A Laceration without foreign body of left index finger without damage to nail, initial encounter: Secondary | ICD-10-CM | POA: Diagnosis not present

## 2017-10-18 DIAGNOSIS — S6992XA Unspecified injury of left wrist, hand and finger(s), initial encounter: Secondary | ICD-10-CM | POA: Diagnosis present

## 2017-10-18 MED ORDER — LIDOCAINE HCL (PF) 1 % IJ SOLN
5.0000 mL | Freq: Once | INTRAMUSCULAR | Status: DC
  Filled 2017-10-18: qty 5

## 2017-10-18 MED ORDER — CEPHALEXIN 500 MG PO CAPS
500.0000 mg | ORAL_CAPSULE | Freq: Three times a day (TID) | ORAL | 0 refills | Status: DC
Start: 2017-10-18 — End: 2018-06-08

## 2017-10-18 MED ORDER — BACITRACIN ZINC 500 UNIT/GM EX OINT: TOPICAL_OINTMENT | Freq: Once | CUTANEOUS | Status: DC

## 2017-10-18 NOTE — ED Notes (Signed)
Patient transported to X-ray 

## 2017-10-18 NOTE — ED Triage Notes (Signed)
Pt states he cut left index finger on grinder approx 10 hour PTA-lac noted across knuckle-bleeding-gauze/kling applied-NAD-steady gait

## 2017-10-18 NOTE — ED Notes (Signed)
Lac w metal grinder to top of knuckle on first finger left hand  approx 3/4 lac,  Bleeding controlled  Soaking in betadine

## 2017-10-18 NOTE — ED Provider Notes (Signed)
[facility] EMERGENCY DEPARTMENT Provider Note   CSN: [id number] Arrival date & time: 10/18/17  2019     History   Chief Complaint Chief Complaint  Patient presents with  . Finger Injury    HPI Michael Esparza is a 22 y.o. male.  The history is provided by the patient and medical records. No language interpreter was used.   Michael Esparza is a 22 y.o. male who presents to the emergency department for laceration to the left index finger which began about an hour prior to arrival.  Patient states that he was helping a friend work on a car when he accidentally cut himself with a metal grinder to the top of the left index finger knuckle.  He applied pressure to the area which controlled bleeding.  His tetanus is up-to-date.  Medications taken prior to arrival for symptoms.  No numbness, tingling or weakness.  Past Medical History:  Diagnosis Date  . ADHD (attention deficit hyperactivity disorder)   . Asperger's syndrome   . Fracture of left hand 2014   laceration, hedge clipping  . Post concussion syndrome 07/21/2014    Patient Active Problem List   Diagnosis Date Noted  . Seasonal allergic rhinitis due to pollen 03/16/2017  . Overweight, pediatric, BMI 85.0-94.9 percentile for age 67/22/2017  . Thumb fracture 12/22/2015  . Acne 07/23/2015  . Influenza vaccination declined 07/11/2014  . Attention deficit hyperactivity disorder (ADHD), combined type 06/12/2014  . Asperger syndrome 06/12/2014    Past Surgical History:  Procedure Laterality Date  . OPEN REDUCTION INTERNAL FIXATION (ORIF) HAND Left 12/21/2015   Procedure: OPEN REDUCTION INTERNAL FIXATION (ORIF) HAND, NERVE AND TENDON REPAIR;  Surgeon: Milly Jakob, MD;  Location: [facility];  Service: Orthopedics;  Laterality: Left;       Home Medications    Prior to Admission medications   Medication Sig Start Date End Date Taking? Authorizing Provider  ADDERALL XR 25 MG 24 hr capsule Take 2 cap in morning (50 mg) and  one (25 mg ) at 3 pm. 12/10/17 01/10/18  Roselind Messier, MD  ADDERALL XR 25 MG 24 hr capsule Take 2 in mornin g( 50 mg) and one about 3 pm (25 mg) 11/11/17 12/09/17  Roselind Messier, MD  ADDERALL XR 25 MG 24 hr capsule Take 2 cap in morning (50 mg) and one (25 mg ) at 3 pm. 10/10/17 11/10/17  Roselind Messier, MD  cephALEXin (KEFLEX) 500 MG capsule Take 1 capsule (500 mg total) by mouth 3 (three) times daily. 10/18/17   Meera Vasco, Ozella Almond, PA-C  cetirizine (ZYRTEC) 10 MG tablet Take 1 tablet (10 mg total) by mouth daily. 03/16/17   Roselind Messier, MD  fluticasone (FLONASE) 50 MCG/ACT nasal spray Place 1 spray into both nostrils daily. 1 spray in each nostril every day 03/16/17   Roselind Messier, MD  montelukast (SINGULAIR) 10 MG tablet Take 1 tablet (10 mg total) by mouth at bedtime. Patient not taking: Reported on 07/07/2017 03/16/17   Roselind Messier, MD    Family History Family History  Problem Relation Age of Onset  . Hypertension Mother   . Arthritis Father 84       anklosing spondylosis    Social History Social History   Tobacco Use  . Smoking status: Never Smoker  . Smokeless tobacco: Never Used  Substance Use Topics  . Alcohol use: No  . Drug use: No     Allergies   Patient has no known allergies.   Review of Systems  Review of Systems  Musculoskeletal: Positive for myalgias.  Skin: Positive for wound.  Neurological: Negative for weakness and numbness.     Physical Exam Updated Vital Signs BP 128/85 (BP Location: Right Arm)   Pulse 89   Temp 98.8 F (37.1 C) (Oral)   Resp 18   Ht 5\' 10"  (1.778 m)   Wt 76.2 kg (168 lb)   SpO2 98%   BMI 24.11 kg/m   Physical Exam  Constitutional: He appears well-developed and well-nourished. No distress.  HENT:  Head: Normocephalic and atraumatic.  Neck: Neck supple.  Cardiovascular: Normal rate, regular rhythm and normal heart sounds.  No murmur heard. Pulmonary/Chest: Effort normal and breath sounds normal. No  respiratory distress. He has no wheezes. He has no rales.  Musculoskeletal:  2 cm laceration over the knuckle of left index finger. Left index finger with full ROM and good cap refill. Sensation intact. 2+ radial pulse.   Neurological: He is alert.  Skin: Skin is warm and dry.  Nursing note and vitals reviewed.    ED Treatments / Results  Labs (all labs ordered are listed, but only abnormal results are displayed) Labs Reviewed - No data to display  EKG  EKG Interpretation None       Radiology Dg Finger Index Left  Result Date: 10/18/2017 CLINICAL DATA:  Index finger caught in grinder EXAM: LEFT INDEX FINGER 2+V COMPARISON:  None. FINDINGS: There is no evidence of fracture or dislocation. There is no evidence of arthropathy or other focal bone abnormality. No radiopaque foreign body IMPRESSION: No acute osseous abnormality Electronically Signed   By: Donavan Foil M.D.   On: 10/18/2017 20:57    Procedures .Marland KitchenLaceration Repair Date/Time: 10/18/2017 10:29 PM Performed by: Dyllon Henken, Ozella Almond, PA-C Authorized by: Kiasha Bellin, Ozella Almond, PA-C   Consent:    Consent obtained:  Verbal   Consent given by:  Patient   Risks discussed:  Pain, infection, poor cosmetic result and poor wound healing Anesthesia (see MAR for exact dosages):    Anesthesia method:  Local infiltration   Local anesthetic:  Lidocaine 1% w/o epi Laceration details:    Location:  Finger   Finger location:  L index finger   Length (cm):  2 Repair type:    Repair type:  Simple Pre-procedure details:    Preparation:  Imaging obtained to evaluate for foreign bodies Exploration:    Hemostasis achieved with:  Direct pressure   Wound exploration: wound explored through full range of motion and entire depth of wound probed and visualized   Treatment:    Area cleansed with:  Betadine   Amount of cleaning:  Standard   Irrigation solution:  Sterile saline Skin repair:    Repair method:  Sutures   Suture size:  4-0    Suture material:  Prolene   Suture technique:  Simple interrupted   Number of sutures:  4 Approximation:    Approximation:  Close Post-procedure details:    Dressing:  Antibiotic ointment   Patient tolerance of procedure:  Tolerated well, no immediate complications   (including critical care time)  Medications Ordered in ED Medications  lidocaine (PF) (XYLOCAINE) 1 % injection 5 mL (not administered)  bacitracin ointment (not administered)     Initial Impression / Assessment and Plan / ED Course  I have reviewed the triage vital signs and the nursing notes.  Pertinent labs & imaging results that were available during my care of the patient were reviewed by me and considered in my  medical decision making (see chart for details).    Michael Esparza is a 22 y.o. male who presents to ED for laceration of left index finger. Wound thoroughly cleaned in ED today. Wound explored and bottom of wound seen in a bloodless field. X-ray negative. Laceration repaired as dictated above. Patient counseled on home wound care. Follow up with PCP/urgent care or return to ER for suture removal in 7-10 days. Tetanus is up-to-date. Will start on keflex for prophylaxis. Patient was urged to return to the Emergency Department for worsening pain, swelling, expanding erythema especially if it streaks away from the affected area, fever, or for any additional concerns. Patient verbalized understanding. All questions answered.   Final Clinical Impressions(s) / ED Diagnoses   Final diagnoses:  Laceration of left index finger without foreign body without damage to nail, initial encounter    ED Discharge Orders        Ordered    cephALEXin (KEFLEX) 500 MG capsule  3 times daily     10/18/17 2224       Shauni Henner, Ozella Almond, PA-C 10/18/17 2231    Julianne Rice, MD 10/20/17 [phone]

## 2017-10-18 NOTE — Discharge Instructions (Signed)
It was my pleasure taking care of you today!   Please take all of your antibiotics until finished! This will help prevent infection.  Keep wound clean with mild soap and water. Keep area covered with a topical antibiotic ointment and bandage, keep bandage dry. Ice and elevate for additional pain relief and swelling. Alternate between ibuprofen and Tylenol for additional pain relief. Follow up with your primary care doctor, [facility] or ER in approximately 7-10 days for wound recheck and suture removal. Monitor area for signs of infection to include, but not limited to: increasing pain, spreading redness, drainage/pus, worsening swelling, or fevers. Return to emergency department for emergent changing or worsening symptoms.

## 2017-11-24 ENCOUNTER — Telehealth: Payer: Self-pay | Admitting: *Deleted

## 2017-11-24 NOTE — Telephone Encounter (Signed)
A user error has taken place: encounter opened in error, closed for administrative reasons.

## 2017-11-29 ENCOUNTER — Telehealth: Payer: Self-pay | Admitting: Pediatrics

## 2017-11-29 ENCOUNTER — Encounter: Payer: Self-pay | Admitting: *Deleted

## 2017-11-29 NOTE — Telephone Encounter (Signed)
Letter written. Will call mother with information and to pick up letter.

## 2017-11-29 NOTE — Telephone Encounter (Signed)
Please write a letter saying that he is our patient. His medical concerns include ADHD and Asperger's syndrome. Please consider a deferment from [place] duty.   For the mother, I do not know if the deferral will be granted. We can write the letter.

## 2017-11-29 NOTE — Telephone Encounter (Signed)
-----   Message from Lolita Rieger, RN sent at 11/24/2017 11:17 AM EST ----- Regarding: [place] Duty Summons Patient was summoned for [facility] and mom is requesting a letter from you to support his deferment due to his health issues. Call mom when letter is ready.

## 2017-12-08 ENCOUNTER — Ambulatory Visit (INDEPENDENT_AMBULATORY_CARE_PROVIDER_SITE_OTHER): Payer: Medicaid Other | Admitting: Pediatrics

## 2017-12-08 ENCOUNTER — Encounter: Payer: Self-pay | Admitting: Pediatrics

## 2017-12-08 VITALS — Temp 98.1°F | Wt 170.2 lb

## 2017-12-08 DIAGNOSIS — R69 Illness, unspecified: Secondary | ICD-10-CM

## 2017-12-08 DIAGNOSIS — R509 Fever, unspecified: Secondary | ICD-10-CM | POA: Diagnosis not present

## 2017-12-08 DIAGNOSIS — J111 Influenza due to unidentified influenza virus with other respiratory manifestations: Secondary | ICD-10-CM

## 2017-12-08 DIAGNOSIS — J029 Acute pharyngitis, unspecified: Secondary | ICD-10-CM

## 2017-12-08 LAB — POCT RAPID STREP A (OFFICE): RAPID STREP A SCREEN: NEGATIVE

## 2017-12-08 LAB — POC INFLUENZA A&B (BINAX/QUICKVUE)
INFLUENZA B, POC: NEGATIVE
Influenza A, POC: NEGATIVE

## 2017-12-08 MED ORDER — OSELTAMIVIR PHOSPHATE 75 MG PO CAPS: 75.0000 mg | ORAL_CAPSULE | Freq: Two times a day (BID) | ORAL | 0 refills | Status: AC

## 2017-12-08 NOTE — Progress Notes (Signed)
   Subjective:     Michael Esparza, is a 22 y.o. male  HPI  Chief Complaint  Patient presents with  . Sore Throat    x4days; feels like throat is on fire  . Nasal Congestion    cant breath and not sleeping well    Current illness: got much worse yesterday  Fever: tactile temp, chill last night   Vomiting: no Diarrhea: no Other symptoms such as sore throat or Headache?: cough and sneezing No flu shot   Appetite  decreased?: no Urine Output decreased?: no  Ill contacts: at work and at home Smoke exposure; no Day care:  no Travel out of city: no  Review of Systems   The following portions of the patient's history were reviewed and updated as appropriate: allergies, current medications, past family history, past medical history, past social history, past surgical history and problem list.     Objective:     Temperature 98.1 F (36.7 C), weight 170 lb 3.2 oz (77.2 kg).  Physical Exam  Constitutional: He appears well-developed and well-nourished. No distress.  Mildly ill appearing  HENT:  Head: Normocephalic and atraumatic.  Nose: Nose normal.  Mild erythema of posterior pharynx, no exudate on tonsils  Eyes: Conjunctivae and EOM are normal. Right eye exhibits no discharge. Left eye exhibits no discharge.  Neck: Normal range of motion. No thyromegaly present.  Cardiovascular: Normal rate, regular rhythm and normal heart sounds.  No murmur heard. Pulmonary/Chest: No respiratory distress. He has no wheezes. He has no rales.  Lots of ocugh  Abdominal: Soft. He exhibits no distension. There is no tenderness.  Lymphadenopathy:    He has no cervical adenopathy.  Skin: Skin is warm and dry. No rash noted.       Assessment & Plan:   1. Influenza-like illness  Flu test neg, but having cough, fever, and chills during flu  - oseltamivir (TAMIFLU) 75 MG capsule; Take 1 capsule (75 mg total) by mouth 2 (two) times daily for 5 days.  Dispense: 10 capsule; Refill: 0  2.  Sore throat Rapid strep negative,  Cough prominent in history make strep less likely - POCT rapid strep A - Culture, Group A Strep  3. Fever, unspecified fever cause neg - POC Influenza A&B(BINAX/QUICKVUE)  Work excuse for next two days   Supportive care and return precautions reviewed.  Spent  15  minutes face to face time with patient; greater than 50% spent in counseling regarding diagnosis and treatment plan.   Roselind Messier, MD

## 2017-12-10 LAB — CULTURE, GROUP A STREP
MICRO NUMBER: 90299909
SPECIMEN QUALITY: ADEQUATE

## 2018-01-09 ENCOUNTER — Ambulatory Visit: Payer: Medicaid Other | Admitting: Pediatrics

## 2018-01-09 ENCOUNTER — Encounter: Payer: Medicaid Other | Admitting: Licensed Clinical Social Worker

## 2018-01-09 ENCOUNTER — Other Ambulatory Visit: Payer: Self-pay

## 2018-01-09 DIAGNOSIS — F902 Attention-deficit hyperactivity disorder, combined type: Secondary | ICD-10-CM

## 2018-01-09 NOTE — Telephone Encounter (Signed)
Mom left message on nurse line apologizing for missed appointment this morning; they have new phone number. Mom has rescheduled for 01/25/18 but requests bridge RX of Adderall from 01/12/18-01/25/18. Phone number has been updated in [facility].

## 2018-01-10 MED ORDER — ADDERALL XR 25 MG PO CP24: ORAL_CAPSULE | ORAL | 0 refills | Status: DC

## 2018-01-10 NOTE — Telephone Encounter (Signed)
I apologize for delay, sent 13 day supply

## 2018-01-11 NOTE — Telephone Encounter (Signed)
Mom notified.

## 2018-01-25 ENCOUNTER — Encounter: Payer: Medicaid Other | Admitting: Licensed Clinical Social Worker

## 2018-01-25 ENCOUNTER — Encounter: Payer: Self-pay | Admitting: Pediatrics

## 2018-01-25 ENCOUNTER — Ambulatory Visit (INDEPENDENT_AMBULATORY_CARE_PROVIDER_SITE_OTHER): Payer: Medicaid Other | Admitting: Pediatrics

## 2018-01-25 VITALS — BP 117/78 | HR 63 | Ht 69.09 in | Wt 171.8 lb

## 2018-01-25 DIAGNOSIS — F845 Asperger's syndrome: Secondary | ICD-10-CM | POA: Diagnosis not present

## 2018-01-25 DIAGNOSIS — Z0001 Encounter for general adult medical examination with abnormal findings: Secondary | ICD-10-CM

## 2018-01-25 DIAGNOSIS — Z113 Encounter for screening for infections with a predominantly sexual mode of transmission: Secondary | ICD-10-CM

## 2018-01-25 DIAGNOSIS — E663 Overweight: Secondary | ICD-10-CM | POA: Diagnosis not present

## 2018-01-25 DIAGNOSIS — F902 Attention-deficit hyperactivity disorder, combined type: Secondary | ICD-10-CM | POA: Diagnosis not present

## 2018-01-25 LAB — POCT RAPID HIV: RAPID HIV, POC: NEGATIVE

## 2018-01-25 MED ORDER — ADDERALL XR 25 MG PO CP24: ORAL_CAPSULE | ORAL | 0 refills | Status: DC

## 2018-01-25 NOTE — Progress Notes (Signed)
Adolescent Well Care Visit Michael Esparza is a 22 y.o. male who is here for well care.    PCP:  Roselind Messier, MD   History was provided by the patient.  Confidentiality was discussed with the patient and, if applicable, with caregiver as well. Patient's personal or confidential phone number: [phone]   Current Issues: Current concerns include none.  Still taking Adderall. States that it helps him concentrate at work.   OS/Side effects? No changes in appetitie No change in sleep No headache or dizziness No chest pain or palpitations No nausea, vomiting, or abd pain No change in mood, no elation  Nutrition: Nutrition/Eating Behaviors: meat, pizza, rarely eats fruits and vegetables Adequate calcium in diet?: drinks 2 glasses a day Supplements/ Vitamins: no  Exercise/ Media: Play any Sports?/ Exercise: active at work (climbs trees) Screen Time:  > 2 hours-counseling provided Media Rules or Monitoring?: N/A (adult)  Sleep:  Sleep: only 6-7 hours of sleep  Social Screening: Lives with:  Mom and brother (67) Parental relations:  good Activities, Work, and Research officer, political party?: 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  no Stressors of note: no  Education: Works at [employer] Social History: Tobacco?  no Secondhand smoke exposure?  Yes (friends) Drugs/ETOH?  no  Interested in women.  Sexually Active?  no   Pregnancy Prevention: abstinence   Safe at home, in school & in relationships?  Yes Safe to self?  Yes   Screenings: Patient has a dental home: yes  The patient completed the Rapid Assessment for Adolescent Preventive Services screening questionnaire and the following topics were identified as risk factors and discussed: healthy eating, sexuality and screen time  In addition, the following topics were discussed as part of anticipatory guidance exercise, tobacco use, marijuana use, drug use, condom use and birth  control.  PHQ-9 completed and results indicated score of 0 (low risk).   Physical Exam:  Vitals:   01/25/18 0854  BP: 117/78  Pulse: 63  Weight: 171 lb 12.8 oz (77.9 kg)  Height: 5' 9.09" (1.755 m)   BP 117/78   Pulse 63   Ht 5' 9.09" (1.755 m)   Wt 171 lb 12.8 oz (77.9 kg)   BMI 25.30 kg/m  Body mass index: body mass index is 25.3 kg/m. Growth percentile SmartLinks can only be used for patients less than 91 years old.   Hearing Screening   Method: Audiometry   125Hz  250Hz  500Hz  1000Hz  2000Hz  3000Hz  4000Hz  6000Hz  8000Hz   Right ear:   20 20 20  20     Left ear:   20 20 20  20       Visual Acuity Screening   Right eye Left eye Both eyes  Without correction: 20/16 20/16 20/16   With correction:       General Appearance:   alert, oriented, no acute distress  HENT: Normocephalic, no obvious abnormality, conjunctiva clear  Mouth:   Normal appearing teeth, some stains, no dental caries, or dental caps  Neck:   Supple;   Lungs:   Clear to auscultation bilaterally, normal work of breathing  Heart:   Regular rate and rhythm, S1 and S2 normal, no murmurs;   Abdomen:   Soft, non-tender, no mass, or organomegaly  GU normal male genitals, no testicular masses or hernia, Tanner stage 5  Musculoskeletal:   Tone and strength strong and symmetrical, all extremities               Lymphatic:   No  cervical adenopathy  Skin/Hair/Nails:   Skin warm, dry and intact, no rashes, no bruises or petechiae  Neurologic:   Strength, gait, and coordination normal and age-appropriate     Assessment and Plan:   1. Encounter for general adult medical examination with abnormal findings Doing well. Discussed finding an adult provider in the next year.  Discussed sleeping more (at least 8 hours) and wearing protective equipment at work (multiple traumas at work).  Discussed wearing sunscreen.   Hearing screening result:normal Vision screening result: normal  2. Routine screening for STI (sexually  transmitted infection) - POCT Rapid HIV: negative - C. trachomatis/N. gonorrhoeae RNA: pending  3. Attention deficit hyperactivity disorder (ADHD), combined type Doing well on current medication regimen. No side effects. Will refill for 3 months. - ADDERALL XR 25 MG 24 hr capsule; Take 2 cap in morning (50 mg) and one (25 mg ) at 3 pm.    4. Overweight (BMI 25.0-29.9) BMI is not appropriate for age Discussed eating more fruits and vegetables.  Already cut back on sweet tea and only drinks water. Very physically active at work  5. Asperger syndrome    Return in about 3 months (around 04/26/2018) for ADHD follow up.Sharin Mons, MD

## 2018-01-26 LAB — C. TRACHOMATIS/N. GONORRHOEAE RNA
C. trachomatis RNA, TMA: NOT DETECTED
N. gonorrhoeae RNA, TMA: NOT DETECTED

## 2018-04-02 ENCOUNTER — Telehealth: Payer: Self-pay

## 2018-04-02 DIAGNOSIS — F902 Attention-deficit hyperactivity disorder, combined type: Secondary | ICD-10-CM

## 2018-04-02 NOTE — Telephone Encounter (Signed)
Mom left message on nurse line asking why most recent RX for adderall sent 03/27/18 was only for quantity 39; Colman takes 2 pills in the morning and 1 in the afternoon, so he needs 90 per month.

## 2018-04-03 MED ORDER — ADDERALL XR 25 MG PO CP24: ORAL_CAPSULE | ORAL | 0 refills | Status: DC

## 2018-04-03 NOTE — Telephone Encounter (Signed)
I apologize for the inconvenience.  I re-ordered a full month supply this evening and it should be available.

## 2018-04-03 NOTE — Telephone Encounter (Signed)
The intention was to provide enough medicine to get him to his appointment on7/25.

## 2018-04-04 NOTE — Telephone Encounter (Signed)
Mother was notified about Rx quantity fix.

## 2018-04-26 ENCOUNTER — Ambulatory Visit: Payer: Medicaid Other | Admitting: Pediatrics

## 2018-05-11 ENCOUNTER — Ambulatory Visit: Payer: Medicaid Other | Admitting: Pediatrics

## 2018-05-28 ENCOUNTER — Other Ambulatory Visit: Payer: Self-pay

## 2018-05-28 DIAGNOSIS — F902 Attention-deficit hyperactivity disorder, combined type: Secondary | ICD-10-CM

## 2018-05-28 NOTE — Telephone Encounter (Signed)
Mom left message on nurse line requesting bridge RX of Adderall to last until appointment with Dr. Jess Barters scheduled for 06/08/18; Michael Esparza will run out after tomorrow's dose.

## 2018-05-29 ENCOUNTER — Other Ambulatory Visit: Payer: Self-pay | Admitting: Pediatrics

## 2018-05-29 DIAGNOSIS — F902 Attention-deficit hyperactivity disorder, combined type: Secondary | ICD-10-CM

## 2018-05-29 MED ORDER — AMPHETAMINE-DEXTROAMPHET ER 25 MG PO CP24: ORAL_CAPSULE | ORAL | 0 refills | Status: DC

## 2018-05-29 NOTE — Telephone Encounter (Signed)
Ordered by L Stryffeleer

## 2018-05-29 NOTE — Progress Notes (Signed)
Request for bridge adderall prescription until child seen in office on 06/08/18.   Prescription sent to pharmacy of record. Satira Mccallum MSN, CPNP, CDE

## 2018-05-30 MED ORDER — ADDERALL XR 25 MG PO CP24: ORAL_CAPSULE | ORAL | 0 refills | Status: DC

## 2018-05-30 NOTE — Telephone Encounter (Signed)
Called pharmacy to ask since ordered  As generic and previously filled as band name.   They filled brand name yesterday.  They will delete my order today for brand name. Marland Kitchen

## 2018-05-30 NOTE — Addendum Note (Signed)
Addended by: Roselind Messier on: 05/30/2018 10:27 AM   Modules accepted: Orders

## 2018-06-08 ENCOUNTER — Ambulatory Visit (INDEPENDENT_AMBULATORY_CARE_PROVIDER_SITE_OTHER): Payer: Medicaid Other | Admitting: Pediatrics

## 2018-06-08 ENCOUNTER — Encounter: Payer: Self-pay | Admitting: Pediatrics

## 2018-06-08 DIAGNOSIS — F902 Attention-deficit hyperactivity disorder, combined type: Secondary | ICD-10-CM | POA: Diagnosis not present

## 2018-06-08 MED ORDER — ADDERALL XR 25 MG PO CP24: ORAL_CAPSULE | ORAL | 0 refills | Status: DC
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
## 2018-06-08 NOTE — Patient Instructions (Signed)
Good to see you today! Thank you for coming in.

## 2018-07-17 ENCOUNTER — Other Ambulatory Visit: Payer: Self-pay

## 2018-07-17 ENCOUNTER — Emergency Department (HOSPITAL_BASED_OUTPATIENT_CLINIC_OR_DEPARTMENT_OTHER)
Admission: EM | Admit: 2018-07-17 | Discharge: 2018-07-17 | Disposition: A | Discharge status: Home / Self Care | Payer: Medicaid Other | Attending: Emergency Medicine | Admitting: Emergency Medicine

## 2018-07-17 ENCOUNTER — Encounter (HOSPITAL_BASED_OUTPATIENT_CLINIC_OR_DEPARTMENT_OTHER): Payer: Self-pay

## 2018-07-17 ENCOUNTER — Emergency Department (HOSPITAL_BASED_OUTPATIENT_CLINIC_OR_DEPARTMENT_OTHER): Payer: Medicaid Other

## 2018-07-17 DIAGNOSIS — R0989 Other specified symptoms and signs involving the circulatory and respiratory systems: Secondary | ICD-10-CM | POA: Diagnosis not present

## 2018-07-17 DIAGNOSIS — F845 Asperger's syndrome: Secondary | ICD-10-CM | POA: Insufficient documentation

## 2018-07-17 DIAGNOSIS — Z79899 Other long term (current) drug therapy: Secondary | ICD-10-CM | POA: Insufficient documentation

## 2018-07-17 DIAGNOSIS — R0602 Shortness of breath: Secondary | ICD-10-CM | POA: Diagnosis not present

## 2018-07-17 NOTE — Discharge Instructions (Addendum)
You can take ibuprofen, available over the counter as needed for pain.  Get rechecked immediately if you have any new or concerning symptoms.

## 2018-07-17 NOTE — ED Notes (Signed)
ED Provider at bedside. 

## 2018-07-17 NOTE — ED Triage Notes (Signed)
C/o SOB x 3 days-nonprod cough x 3 weeks-had ~2 min syncopal episode at work today ~3pm-NAD-steady gait

## 2018-07-17 NOTE — ED Provider Notes (Signed)
[facility] EMERGENCY DEPARTMENT Provider Note   CSN: [id number] Arrival date & time: 07/17/18  2045     History   Chief Complaint Chief Complaint  Patient presents with  . Shortness of Breath    HPI Michael Esparza is a 22 y.o. male.  The history is provided by the patient. No language interpreter was used.  Shortness of Breath    Michael Esparza is a 22 y.o. male who presents to the Emergency Department complaining of sob, sore throat. Presents to the emergency department for evaluation of shortness of breath and sore throat for the last five days. He has intermittent episodes where he feels like his throat is swelling and closing up. They had been off and on over the last several days. The most recent episode was when he was taking a break at work. He felt like he was going to collapse so he let himself down to the ground. He did not lose consciousness. Denies any fevers, cough, chest pain, hemoptysis, leg swelling or pain. Triage note reports syncope, patient denies any syncope. No family medical history. He does have some throat irritation currently. Past Medical History:  Diagnosis Date  . ADHD (attention deficit hyperactivity disorder)   . Asperger's syndrome   . Fracture of left hand 2014   laceration, hedge clipping  . Post concussion syndrome 07/21/2014    Patient Active Problem List   Diagnosis Date Noted  . Seasonal allergic rhinitis due to pollen 03/16/2017  . Overweight, pediatric, BMI 85.0-94.9 percentile for age 16/22/2017  . Thumb fracture 12/22/2015  . Acne 07/23/2015  . Influenza vaccination declined 07/11/2014  . Attention deficit hyperactivity disorder (ADHD), combined type 06/12/2014  . Asperger syndrome 06/12/2014    Past Surgical History:  Procedure Laterality Date  . OPEN REDUCTION INTERNAL FIXATION (ORIF) HAND Left 12/21/2015   Procedure: OPEN REDUCTION INTERNAL FIXATION (ORIF) HAND, NERVE AND TENDON REPAIR;  Surgeon: Milly Jakob, MD;   Location: [facility];  Service: Orthopedics;  Laterality: Left;        Home Medications    Prior to Admission medications   Medication Sig Start Date End Date Taking? Authorizing Provider  ADDERALL XR 25 MG 24 hr capsule Take 2 cap in morning (50 mg) and one (25 mg ) at 3 pm. 08/10/18 09/09/18  Roselind Messier, MD  ADDERALL XR 25 MG 24 hr capsule Take 2 in mornin g( 50 mg) and one about 3 pm (25 mg) 07/09/18 08/09/18  Roselind Messier, MD  ADDERALL XR 25 MG 24 hr capsule Take 2 capsules in morning and one about 3 pm 06/08/18 07/08/18  Roselind Messier, MD  cetirizine (ZYRTEC) 10 MG tablet Take 1 tablet (10 mg total) by mouth daily. 03/16/17   Roselind Messier, MD  fluticasone (FLONASE) 50 MCG/ACT nasal spray Place 1 spray into both nostrils daily. 1 spray in each nostril every day 03/16/17   Roselind Messier, MD  montelukast (SINGULAIR) 10 MG tablet Take 1 tablet (10 mg total) by mouth at bedtime. 03/16/17   Roselind Messier, MD    Family History Family History  Problem Relation Age of Onset  . Hypertension Mother   . Arthritis Father 4       anklosing spondylosis  . Hyperlipidemia Maternal Grandmother   . Diabetes Maternal Grandfather     Social History Social History   Tobacco Use  . Smoking status: Never Smoker  . Smokeless tobacco: Never Used  Substance Use Topics  . Alcohol use: No  . Drug use:  No     Allergies   Patient has no known allergies.   Review of Systems Review of Systems  Respiratory: Positive for shortness of breath.   All other systems reviewed and are negative.    Physical Exam Updated Vital Signs BP (!) 149/88 (BP Location: Left Arm)   Pulse 86   Temp 98.5 F (36.9 C) (Oral)   Resp 18   Ht 5\' 9"  (1.753 m)   Wt 79.4 kg   SpO2 100%   BMI 25.84 kg/m   Physical Exam  Constitutional: He is oriented to person, place, and time. He appears well-developed and well-nourished.  HENT:  Head: Normocephalic and atraumatic.  Mouth/Throat: Oropharynx  is clear and moist.  Eyes: Conjunctivae are normal.  Neck: Neck supple. No tracheal deviation present. No thyromegaly present.  Cardiovascular: Normal rate and regular rhythm.  No murmur heard. Pulmonary/Chest: Effort normal and breath sounds normal. No stridor. No respiratory distress.  Abdominal: Soft. There is no tenderness. There is no rebound and no guarding.  Musculoskeletal: He exhibits no edema or tenderness.  Neurological: He is alert and oriented to person, place, and time.  Skin: Skin is warm and dry.  Psychiatric: He has a normal mood and affect. His behavior is normal.  Nursing note and vitals reviewed.    ED Treatments / Results  Labs (all labs ordered are listed, but only abnormal results are displayed) Labs Reviewed - No data to display  EKG EKG Interpretation  Date/Time:  Tuesday July 17 2018 20:58:14 EDT Ventricular Rate:  77 PR Interval:  152 QRS Duration: 96 QT Interval:  370 QTC Calculation: 418 R Axis:   89 Text Interpretation:  Normal sinus rhythm with sinus arrhythmia Normal ECG Confirmed by Quintella Reichert [phone]) on 07/17/2018 10:13:14 PM   Radiology Dg Neck Soft Tissue  Result Date: 07/17/2018 CLINICAL DATA:  Episodic throat swelling for several months. Dyspnea x3 days and cough. EXAM: NECK SOFT TISSUES - 1+ VIEW COMPARISON:  None. FINDINGS: There is no evidence of retropharyngeal soft tissue swelling or epiglottic enlargement. The cervical airway is unremarkable and no radio-opaque foreign body identified. Maintained cervical lordosis. No acute osseous appearing abnormality. Disc spaces are maintained. IMPRESSION: Negative. Electronically Signed   By: Ashley Royalty M.D.   On: 07/17/2018 22:59   Dg Chest 2 View  Result Date: 07/17/2018 CLINICAL DATA:  Distal throat pain radiating to the mid upper chest. Shortness of breath and cough for 3 weeks. Recent syncope. EXAM: CHEST - 2 VIEW COMPARISON:  None. FINDINGS: Slightly shallow inspiration. Heart  size and pulmonary vascularity are normal for technique. Lungs are clear and expanded. No airspace disease or consolidation. No blunting of costophrenic angles. No pneumothorax. Mediastinal contours appear intact. IMPRESSION: No active cardiopulmonary disease. Electronically Signed   By: Lucienne Capers M.D.   On: 07/17/2018 21:34    Procedures Procedures (including critical care time)  Medications Ordered in ED Medications - No data to display   Initial Impression / Assessment and Plan / ED Course  I have reviewed the triage vital signs and the nursing notes.  Pertinent labs & imaging results that were available during my care of the patient were reviewed by me and considered in my medical decision making (see chart for details).     Patient here for evaluation of five days of intermittent episodes of shortness of breath with a sensation that his throat is closing. His episodes last about an hour at a time. These episodes do wake him at  night. He is well appearing on examination with no respiratory distress. No stridor. thyromegaly. No concerning features for RPA, PTA, epiglotitis. Presentation is not consistent with PE, perk negative. Discussed with patient home care for shortness of breath. Discussed outpatient follow-up and return precautions.  Final Clinical Impressions(s) / ED Diagnoses   Final diagnoses:  Globus sensation  Shortness of breath    ED Discharge Orders    None       Quintella Reichert, MD 07/17/18 2315

## 2018-09-14 ENCOUNTER — Telehealth: Payer: Self-pay | Admitting: *Deleted

## 2018-09-14 ENCOUNTER — Other Ambulatory Visit: Payer: Self-pay | Admitting: Pediatrics

## 2018-09-14 DIAGNOSIS — F902 Attention-deficit hyperactivity disorder, combined type: Secondary | ICD-10-CM

## 2018-09-14 MED ORDER — ADDERALL XR 25 MG PO CP24
ORAL_CAPSULE | ORAL | 0 refills | Status: DC
Start: 2018-09-14 — End: 2018-09-21

## 2018-09-14 NOTE — Telephone Encounter (Signed)
Calling for refill for Adderall. Next appointment is on 09/21/2018. Confirmed with mom.

## 2018-09-14 NOTE — Progress Notes (Signed)
Request for Adderall XR 25 mg (50 mg am and 25 mg PM) daily to be refilled. Prescription refilled x 1 month.  Last seen in office by Dr. Jess Barters 06/08/2018 and stable on current above prescription. Satira Mccallum MSN, CPNP, CDE

## 2018-09-21 ENCOUNTER — Encounter: Payer: Self-pay | Admitting: Pediatrics

## 2018-09-21 ENCOUNTER — Other Ambulatory Visit: Payer: Self-pay

## 2018-09-21 ENCOUNTER — Ambulatory Visit (INDEPENDENT_AMBULATORY_CARE_PROVIDER_SITE_OTHER): Payer: Medicaid Other | Admitting: Pediatrics

## 2018-09-21 VITALS — BP 140/76 | HR 75 | Ht 69.5 in | Wt 175.8 lb

## 2018-09-21 DIAGNOSIS — Z00121 Encounter for routine child health examination with abnormal findings: Secondary | ICD-10-CM | POA: Diagnosis not present

## 2018-09-21 DIAGNOSIS — F902 Attention-deficit hyperactivity disorder, combined type: Secondary | ICD-10-CM

## 2018-09-21 DIAGNOSIS — J069 Acute upper respiratory infection, unspecified: Secondary | ICD-10-CM

## 2018-09-21 MED ORDER — ADDERALL XR 25 MG PO CP24: ORAL_CAPSULE | ORAL | 0 refills | Status: DC

## 2018-09-21 NOTE — Progress Notes (Signed)
Subjective:     Michael Esparza, is a 22 y.o. male  HPI  Chief Complaint  Patient presents with  . Follow-up    ADHD  . chest congestion    since Monday, no other symptoms     Current illness:  Front of chest hurts He thought he overdid it at work --going back into business for himself [place] home and rested and woke up with chest congestion  No allergy symptoms lately  Fever: no  Vomiting: no Diarrhea: no Other symptoms such as sore throat or Headache?: lots of cough and bringing some stuff up Lots of runny nose  Appetite  decreased?: no Urine Output decreased?: no  Treatments tried?: none  Ill contacts: no Smoke exposure; no, no vaping Day care:  no Travel out of city: no  ROS/ Side effects? No change in appetite, No change in sleep No Headache or dizziness Currently has chest pain from chest congestion  No nausea, vomiting or abd pain No change in mood, no elation, no wearing off dysphoria  Substance abuse: yes Mood instability: happy Tics: no Disruptive behaviors: no Learning difficulties: yes Anxiety: no  Adderall 25 mg (50 mg am and 25 mg pm Refilled for one month 09/14/18   Last office visits 06/08/18 Last well evaluation 01/2018  ARSA: Describing self for on medicine completed to day Symptoms well controlled   Review of Systems  History and Problem List: Michael Esparza has Attention deficit hyperactivity disorder (ADHD), combined type; Asperger syndrome; Influenza vaccination declined; Acne; Thumb fracture; Overweight, pediatric, BMI 85.0-94.9 percentile for age; and Seasonal allergic rhinitis due to pollen on their problem list.  Michael Esparza  has a past medical history of ADHD (attention deficit hyperactivity disorder), Asperger's syndrome, Fracture of left hand (2014), and Post concussion syndrome (07/21/2014).  The following portions of the patient's history were reviewed and updated as appropriate: allergies, current medications, past family history, past  medical history, past social history, past surgical history and problem list.     Objective:     BP 140/76 (BP Location: Right Arm, Patient Position: Sitting, Cuff Size: Normal)   Pulse 75   Ht 5' 9.5" (1.765 m)   Wt 175 lb 12.8 oz (79.7 kg)   BMI 25.59 kg/m    Physical Exam Constitutional:      General: He is not in acute distress.    Appearance: He is well-developed.  HENT:     Head: Normocephalic and atraumatic.     Nose: Nose normal.  Eyes:     General:        Right eye: No discharge.        Left eye: No discharge.     Conjunctiva/sclera: Conjunctivae normal.  Neck:     Musculoskeletal: Normal range of motion.     Thyroid: No thyromegaly.  Cardiovascular:     Rate and Rhythm: Normal rate and regular rhythm.     Heart sounds: Normal heart sounds. No murmur.  Pulmonary:     Effort: No respiratory distress.     Breath sounds: No wheezing or rales.     Comments: Occasional cough Abdominal:     General: There is no distension.     Palpations: Abdomen is soft.     Tenderness: There is no abdominal tenderness.  Musculoskeletal:     Comments: Right lower costo-sternal border mild tenderness, not tender with use of pectus muscle  Lymphadenopathy:     Cervical: No cervical adenopathy.  Skin:    General: Skin is warm and  dry.     Findings: No rash.        Assessment & Plan:   1. Attention deficit hyperactivity disorder (ADHD), combined type Stable on current dose,  Usually provide 3 months, and just received this months prescription.  - ADDERALL XR 25 MG 24 hr capsule; Take 2 cap in morning (50 mg) and one (25 mg ) at 3 pm.  Dispense: 90 capsule; Refill: 0 - ADDERALL XR 25 MG 24 hr capsule; Take 2 cap in morning (50 mg) and one (25 mg) in afternoon  Dispense: 90 capsule; Refill: 0  2. Viral upper respiratory tract infection No lower respiratory tract signs suggesting wheezing or pneumonia.  No signs of dehydration or hypoxia.   Expect cough and cold symptoms  to last up to 1-2 weeks duration. symptom care recommended  Transition of care: please find a provider for adult care.   Supportive care and return precautions reviewed.  Spent  25  minutes face to face time with patient; greater than 50% spent in counseling regarding diagnosis and treatment plan.   Roselind Messier, MD

## 2018-12-27 ENCOUNTER — Telehealth: Payer: Self-pay

## 2018-12-27 NOTE — Telephone Encounter (Signed)
1. Have you traveled to any of these locations in the last 14 days? no  [place]  2. Have you had contact with anyone with confirmed COVID-19 in the last 14 days? no   3. Have you had any of these symptoms in the last 14 days? no   Fever greater than 100 Difficulty breathing Cough  4. Are you currently experiencing fever over 100, difficulty breathing or cough? no  If you answered yes to question 1 and-or 2, please call your primary care provider for further direction.  [phone]; spoke with mother

## 2018-12-28 ENCOUNTER — Other Ambulatory Visit: Payer: Self-pay

## 2018-12-28 ENCOUNTER — Encounter: Payer: Self-pay | Admitting: Pediatrics

## 2018-12-28 ENCOUNTER — Ambulatory Visit (INDEPENDENT_AMBULATORY_CARE_PROVIDER_SITE_OTHER): Payer: Medicaid Other | Admitting: Pediatrics

## 2018-12-28 VITALS — BP 118/78 | HR 77 | Ht 69.72 in | Wt 185.6 lb

## 2018-12-28 DIAGNOSIS — F902 Attention-deficit hyperactivity disorder, combined type: Secondary | ICD-10-CM

## 2018-12-28 DIAGNOSIS — F845 Asperger's syndrome: Secondary | ICD-10-CM

## 2018-12-28 DIAGNOSIS — J301 Allergic rhinitis due to pollen: Secondary | ICD-10-CM | POA: Diagnosis not present

## 2018-12-28 DIAGNOSIS — Z7189 Other specified counseling: Secondary | ICD-10-CM

## 2018-12-28 DIAGNOSIS — Z7187 Encounter for pediatric-to-adult transition counseling: Secondary | ICD-10-CM

## 2018-12-28 MED ORDER — ADDERALL XR 25 MG PO CP24: ORAL_CAPSULE | ORAL | 0 refills | Status: DC

## 2018-12-28 MED ORDER — MONTELUKAST SODIUM 10 MG PO TABS: 10.0000 mg | ORAL_TABLET | Freq: Every day | ORAL | 6 refills | Status: DC

## 2018-12-28 MED ORDER — ADDERALL XR 25 MG PO CP24: 50.0000 mg | ORAL_CAPSULE | ORAL | 0 refills | Status: DC

## 2018-12-28 MED ORDER — CETIRIZINE HCL 10 MG PO TABS: 10.0000 mg | ORAL_TABLET | Freq: Every day | ORAL | 6 refills | Status: DC

## 2018-12-28 MED ORDER — FLUTICASONE PROPIONATE 50 MCG/ACT NA SUSP: 1.0000 | Freq: Every day | NASAL | 6 refills | Status: DC

## 2018-12-28 NOTE — Progress Notes (Signed)
Subjective:     Michael Esparza, is a 23 y.o. male  HPI  Chief Complaint  Patient presents with  . Follow-up   Presenting in clinic for follow-up of ADHD  During this corona virus pandemic time, physical exam was limited to critical components only.  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  For example, they are allowed to take a tree down that is going to fall in a house, but they are not allowed to do routine tree trimming.  How is he feeling? Now he is tired. He is working too much, trying to get all the money he can before he can't work  How are his ADHD medicines is working for him right now? Meds:  It has always been the right thing. It is still the right thing.   He likes the choice of medicine and the current dosage which is not a change from previous. It keeps him focused  Any side effects? No Side effects Hungry--he has a good appetite and is more hungry than he used to be Heart racing-no He is gaining weight--might be muscle He has been climbing trees again like he used to. He is getting stronger and feels like his old self and that is nice. His thumb doesn't bother him any more.   After he hurt his thumb he could not climb the trees for a while   Any questions about coronavirus? What are the potential risk of exposure to other people? How should he respond to the risk of exposure to other people? He percieves economic threat to himself and other people. He perceives  threat to the health of himself and to other people.  Allergic rhinitis symptoms? It is currently pollen season Patient has allergic rhinitis--his symptoms are well controlled right now  Review of Systems   The following portions of the patient's history were reviewed and updated as appropriate: allergies, current medications, past family history, past medical history, past social history, past surgical history and problem list.  History and Problem List: Michael Esparza has Attention deficit hyperactivity disorder (ADHD), combined type; Asperger syndrome; Influenza vaccination declined; Acne; Thumb fracture; Overweight, pediatric, BMI 85.0-94.9 percentile for age; and Seasonal allergic rhinitis due to pollen on their problem list.  Michael Esparza  has a past medical history of ADHD (attention deficit hyperactivity disorder), Asperger's syndrome, Fracture of left hand (2014), and Post concussion syndrome (07/21/2014).     Objective:     BP 118/78 (BP Location: Right Arm, Patient Position: Sitting, Cuff Size: Normal)   Pulse 77   Ht 5' 9.72" (1.771 m)   Wt 185 lb 9.6 oz (84.2 kg)   BMI 26.84 kg/m   Physical Exam  Physical exam was limited to vital signs and general observation due to coronavirus pandemic  General: Good historian alert active smiling General physical: Moves easily thinner and stronger than recent visits, but as muscular as he has been in the past. He recently had gained  weight and lost muscle with his injury and decreased work in winter He is wearing work clothes and will be going to work soon    Assessment & Plan:   1. Attention deficit hyperactivity disorder (ADHD), combined type  Doing well on current dose of current medicine. No adjustments needed. No side effects reported  - ADDERALL XR 25 MG 24 hr capsule; Take 2 cap in morning (50 mg) and one (25 mg ) at 3 pm.  Dispense: 90 capsule; Refill: 0 - ADDERALL XR 25 MG 24 hr capsule; Take 2 cap in morning (50 mg) and one (25  mg) in afternoon  Dispense: 90 capsule; Refill: 0  2. Seasonal allergic rhinitis due to pollen  Symptoms currently well controlled Refills provided  - cetirizine (ZYRTEC) 10 MG tablet; Take 1 tablet (10 mg total) by mouth daily.  Dispense: 30 tablet; Refill: 6 - fluticasone (FLONASE) 50 MCG/ACT nasal spray; Place 1 spray into both nostrils daily.  Dispense: 16 g; Refill: 6 - montelukast (SINGULAIR) 10 MG tablet; Take 1 tablet (10 mg total) by mouth at bedtime.  Dispense: 30 tablet; Refill: 6  3. Advice Given About 2019 Novel Coronavirus Infection  Advice given regarding Coronavirus pandemic   Reviewed resources available though our clinic Please call us to speak with a doctor before going to the ED or before coming to the clinic. We can provide face to face visits, phone visits with a nurse or a doctor, and phone and video visits with a doctor.   Discussed risk of exposure to other people. Discussed what are appropriate and inappropriate responses are to the risk of exposure to other people Discussed perceived threat to economic wellbeing of himself and other people. Discussed perceived threat to health of himself and to other people.  Specific details for this patient and family: No  4. Asperger syndrome Co-morbid condition, stable  5. Counseling for transition from pediatric to adult care provider Transition to adult care indicated and previously discussed. Given new COVID,  I am available take care of him and his ADHD until he is 23 yo. When it is safe, he will still need to find provider of adult care.   Discussed risk of exposure to other people Discussed what appropriate and inappropriate responses are to the risk of exposure to other people Discussed perceived threat to economic wellbeing of himself and other people Discussed perceived threat to health of himself and to other people  Allergies: no problem today, no refills needed. I refilled your allergy medicines just  in case you need them during this current pollen season to avoid unnecessary contact with clinic  Spent  25 minutes face to face time with patient; greater than 50% spent in counseling regarding diagnosis and treatment plan.   Roselind Messier, MD

## 2018-12-28 NOTE — Patient Instructions (Signed)
Good to see you today! Thank you for coming in.   ADHD: I will take care of you until you are 23 year old unless Michael Esparza goes away sooner. They you need to find a new doctor now that you are an adult.   I refilled your ADHD medicines with no change  I refilled your allergy medicines today in case you need them.  I will see you in about three months  Call us if you have any questions about anything.  Please call before you come to the clinic.

## 2018-12-29 ENCOUNTER — Encounter: Payer: Self-pay | Admitting: Pediatrics

## 2019-04-02 ENCOUNTER — Ambulatory Visit: Payer: Medicaid Other | Admitting: Pediatrics

## 2019-04-11 ENCOUNTER — Ambulatory Visit: Payer: Medicaid Other | Admitting: Pediatrics

## 2019-06-07 ENCOUNTER — Telehealth: Payer: Self-pay

## 2019-06-20 ENCOUNTER — Ambulatory Visit (INDEPENDENT_AMBULATORY_CARE_PROVIDER_SITE_OTHER): Payer: Medicaid Other | Admitting: Pediatrics

## 2019-06-20 ENCOUNTER — Encounter: Payer: Self-pay | Admitting: Pediatrics

## 2019-06-20 DIAGNOSIS — F902 Attention-deficit hyperactivity disorder, combined type: Secondary | ICD-10-CM | POA: Diagnosis not present

## 2019-06-20 MED ORDER — ADDERALL XR 25 MG PO CP24: ORAL_CAPSULE | ORAL | 0 refills | Status: DC

## 2019-06-20 NOTE — Patient Instructions (Addendum)
The best website for information about children is DividendCut.pl.  All the information is reliable and up-to-date.    Another good website is http://www.wolf.info/  [facility]ult [facility]  Address: [place], [place] [address]  Phone: [phone] Hours: Monday - Friday 9 AM -6 PM  Types of insurance accepted:  Marland Kitchen Pharmacist, community . [employer] [place] (orange card) . Medicaid . Medicare . Uninsured  Language services:  Marland Kitchen Video and phone interpreters available   Ages 92 and older    . Adult primary care . Onsite pharmacy . Integrated behavioral health . Financial assistance counseling . Walk-in hours for established patients  Financial assistance counseling hours: Tuesdays 2:00PM - 5:00PM  Thursday 8:30AM - 4:30PM  Space is limited, 10 on Tuesday and 20 on Thursday. It's on first come first serve basis  Name [facility]  Address: [address] [place], [place] [address]  Phone: [phone]  Hours: Monday - Friday 8:30 AM - 5 PM  Types of insurance accepted:  Marland Kitchen Pharmacist, community . Medicaid . Medicare . Uninsured  Language services:  Marland Kitchen Video and phone interpreters available   All ages - newborn to adult   . Primary care for all ages (children and adults) . Integrated behavioral health . Nutritionist . Financial assistance counseling   Name [facility] on the ground floor of [facility]  Address: [address],  [place]  [address]  Phone: [phone]  Hours: Monday - Friday 8:15 AM - 5 PM  Types of insurance accepted:  Marland Kitchen Pharmacist, community . Medicaid . Medicare . Uninsured  Language services:  Marland Kitchen Video and phone interpreters available   Ages 49 and older   . Adult primary care . Nutritionist . Certified Diabetes  Educator  . Integrated behavioral health . Financial assistance counseling   Name [facility]  Address: [address] [place], [place] [address]  Phone: [phone]  Hours: Monday - Friday 8:30 AM - 5 PM    Types of insurance accepted:  Marland Kitchen Pharmacist, community . Medicaid . Medicare . Uninsured  Language services:  Marland Kitchen Video and phone interpreters available   All ages - newborn to adult   . Primary care for all ages (children and adults) . Integrated behavioral health . Financial assistance counseling

## 2019-06-20 NOTE — Progress Notes (Signed)
Virtual Visit via Video Note  I connected with Michael Esparza 's patient  And his mother on 06/20/19 at  3:30 PM EDT by a video enabled telemedicine application and verified that I am speaking with the correct person using two identifiers.   Location of patient/parent: home   I discussed the limitations of evaluation and management by telemedicine and the availability of in person appointments.  I discussed that the purpose of this telehealth visit is to provide medical care while limiting exposure to the novel coronavirus.  The patient and his mother expressed understanding and agreed to proceed.  Reason for visit:   ADHD FU  History of Present Illness:   Stable dose for years Adderall XR 25 mg 2 in am and one at 3 pm 2 months supply ordered in 12/28/2018  Tried not taking the afternoon.for a while  That didn't work Had to leave working, wasn't able to work Working own company in tree care, arborist  With afternoon dose, doing great  had 4 people working with him, still busy 3 went back to college, now just one   ROS/ Side effects? No change in appetite, No change in sleep No Headache or dizziness No chest pain or palpitations No nausea, vomiting or abd pain No change in mood, no elation, no wearing off dysphoria  Substance abuse: no Mood instability: neutral Disruptive behaviors: no Learning difficulties: n/a Anxiety:  Per mom--anxious without meds,  Driving, is ok   Observations/Objective:   Presents well groomed,  Appropriate with questions and responsibility  Assessment and Plan:   They are looking for a doctor  Mom's doctor isn't taking new patients  Follow Up Instructions:   List of primary care clinic for adults in [place] and to mail to home   I discussed the assessment and treatment plan with the patient and/or parent/guardian. They were provided an opportunity to ask questions and all were answered. They agreed with the plan and demonstrated an  understanding of the instructions.   They were advised to call back or seek an in-person evaluation in the emergency room if the symptoms worsen or if the condition fails to improve as anticipated.  I spent 15 minutes on this telehealth visit inclusive of face-to-face video and care coordination time I was located at clinic during this encounter.  Roselind Messier, MD

## 2019-09-17 ENCOUNTER — Encounter: Payer: Self-pay | Admitting: Pediatrics

## 2019-09-17 ENCOUNTER — Ambulatory Visit (INDEPENDENT_AMBULATORY_CARE_PROVIDER_SITE_OTHER): Payer: Medicaid Other | Admitting: Pediatrics

## 2019-09-17 DIAGNOSIS — F902 Attention-deficit hyperactivity disorder, combined type: Secondary | ICD-10-CM

## 2019-09-17 MED ORDER — ADDERALL XR 25 MG PO CP24: ORAL_CAPSULE | ORAL | 0 refills | Status: DC

## 2019-09-17 NOTE — Progress Notes (Signed)
Virtual Visit via Video Note  I connected with Michael Esparza 's patient  on 09/17/19 at  9:30 AM EST by a video enabled telemedicine application and verified that I am speaking with the correct person using two identifiers.   Location of patient/parent: home   I discussed the limitations of evaluation and management by telemedicine and the availability of in person appointments.  I discussed that the purpose of this telehealth visit is to provide medical care while limiting exposure to the novel coronavirus.  The patient expressed understanding and agreed to proceed.  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 He uses the medicine on weekends, 1-2 times a week he gets too busy to take the afternoon dose  No refill for other medicine-needed  Knows needs to get doctor now that he will be 33 in February They have been looking   He has been too busy working to be anxious or depressed due to pandemic caused issues.  Review of side effects  no trouble with eating, Sleeping--still good No HA Chest pain or palpitations-no No change in weight Mood stable   Observations/Objective:   Appropriate mood, speech Not always look at camera Muscular physique  Assessment and Plan:   ADHD Appropriate dose no changes indicated Refill for 3 months  Please complete process of transition to an adult provider  Follow Up Instructions:    I discussed the assessment and treatment plan with the patient and/or parent/guardian. They were provided an opportunity to ask questions and all were answered. They agreed with the plan and demonstrated an understanding of the instructions.   They were advised to call back or seek an in-person evaluation in the emergency room if  the symptoms worsen or if the condition fails to improve as anticipated.  I spent 20 minutes on this telehealth visit inclusive of face-to-face video and care coordination time I was located at clinic during this encounter.  Roselind Messier, MD

## 2019-12-31 ENCOUNTER — Telehealth: Payer: Self-pay | Admitting: Pediatrics

## 2019-12-31 NOTE — Telephone Encounter (Signed)
Mrs. Shireen Quan would like to speak to you to thank you for all you done for [place]  Please call her @ [phone]

## 2020-01-07 NOTE — Telephone Encounter (Signed)
Returned call.   He has found a new physician and will start at her practice this week.

## 2020-01-29 ENCOUNTER — Encounter (HOSPITAL_BASED_OUTPATIENT_CLINIC_OR_DEPARTMENT_OTHER): Payer: Self-pay | Admitting: *Deleted

## 2020-01-29 ENCOUNTER — Emergency Department (HOSPITAL_BASED_OUTPATIENT_CLINIC_OR_DEPARTMENT_OTHER): Payer: Medicaid Other

## 2020-01-29 ENCOUNTER — Emergency Department (HOSPITAL_BASED_OUTPATIENT_CLINIC_OR_DEPARTMENT_OTHER)
Admission: EM | Admit: 2020-01-29 | Discharge: 2020-01-29 | Disposition: A | Discharge status: Home / Self Care | Payer: Medicaid Other | Attending: Emergency Medicine | Admitting: Emergency Medicine

## 2020-01-29 ENCOUNTER — Other Ambulatory Visit: Payer: Self-pay

## 2020-01-29 DIAGNOSIS — M5134 Other intervertebral disc degeneration, thoracic region: Secondary | ICD-10-CM

## 2020-01-29 DIAGNOSIS — M5127 Other intervertebral disc displacement, lumbosacral region: Secondary | ICD-10-CM | POA: Diagnosis not present

## 2020-01-29 DIAGNOSIS — M5126 Other intervertebral disc displacement, lumbar region: Secondary | ICD-10-CM

## 2020-01-29 DIAGNOSIS — Z79899 Other long term (current) drug therapy: Secondary | ICD-10-CM | POA: Insufficient documentation

## 2020-01-29 DIAGNOSIS — M546 Pain in thoracic spine: Secondary | ICD-10-CM | POA: Diagnosis present

## 2020-01-29 DIAGNOSIS — M5124 Other intervertebral disc displacement, thoracic region: Secondary | ICD-10-CM

## 2020-01-29 DIAGNOSIS — M5136 Other intervertebral disc degeneration, lumbar region: Secondary | ICD-10-CM

## 2020-01-29 MED ORDER — PREDNISONE 10 MG (21) PO TBPK
ORAL_TABLET | Freq: Every day | ORAL | 0 refills | Status: DC
Start: 2020-01-29 — End: 2023-10-13

## 2020-01-29 MED ORDER — KETOROLAC TROMETHAMINE 30 MG/ML IJ SOLN: 30.0000 mg | Freq: Once | INTRAMUSCULAR | Status: DC

## 2020-01-29 MED ORDER — KETOROLAC TROMETHAMINE 60 MG/2ML IM SOLN
30.0000 mg | Freq: Once | INTRAMUSCULAR | Status: AC
  Administered 2020-01-29: 30 mg via INTRAMUSCULAR
  Filled 2020-01-29: qty 2

## 2020-01-29 MED ORDER — METHOCARBAMOL 500 MG PO TABS
500.0000 mg | ORAL_TABLET | Freq: Two times a day (BID) | ORAL | 0 refills | Status: DC
Start: 2020-01-29 — End: 2023-10-13

## 2020-01-29 MED ORDER — OXYCODONE-ACETAMINOPHEN 5-325 MG PO TABS
1.0000 | ORAL_TABLET | Freq: Once | ORAL | Status: AC
  Administered 2020-01-29: 22:00:00 1 via ORAL
  Filled 2020-01-29: qty 1

## 2020-01-29 NOTE — ED Provider Notes (Signed)
[facility] Provider Note   CSN: [id number] Arrival date & time: 01/29/20  1826     History Chief Complaint  Patient presents with  . Back Injury    Michael Esparza is a 24 y.o. male.  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  He states the pain was immediately 10/10.  He states is worse he is ever felt.  He states it is currently 7/10 but worse with movement.  He denies any numbness, tingling, weakness in either upper or lower extremities.  Denies any bowel or bladder incontinence, saddle anesthesia.  He states he is able to walk but is very uncomfortable.     Past Medical History:  Diagnosis Date  . ADHD (attention deficit hyperactivity disorder)   . Asperger's syndrome   . Fracture of left hand 2014   laceration, hedge clipping  . Post concussion syndrome 07/21/2014    Patient Active Problem List   Diagnosis Date Noted  . Seasonal allergic rhinitis due to pollen 03/16/2017  . Overweight, pediatric, BMI 85.0-94.9 percentile for age 41/22/2017  . Thumb fracture 12/22/2015  . Acne 07/23/2015  . Influenza vaccination declined 07/11/2014  . Attention deficit hyperactivity disorder (ADHD), combined type 06/12/2014  . Asperger syndrome 06/12/2014    Past Surgical History:  Procedure Laterality Date  . OPEN REDUCTION INTERNAL FIXATION (ORIF) HAND Left 12/21/2015   Procedure: OPEN REDUCTION INTERNAL FIXATION (ORIF) HAND, NERVE AND TENDON REPAIR;  Surgeon: Milly Jakob, MD;  Location: [facility];  Service: Orthopedics;  Laterality: Left;       Family History  Problem Relation Age of Onset  . Hypertension Mother   . Arthritis Father 22       anklosing spondylosis  . Hyperlipidemia Maternal Grandmother   . Diabetes Maternal Grandfather     Social History   Tobacco Use  .  Smoking status: Never Smoker  . Smokeless tobacco: Never Used  Substance Use Topics  . Alcohol use: No  . Drug use: No    Home Medications Prior to Admission medications   Medication Sig Start Date End Date Taking? Authorizing Provider  ADDERALL XR 25 MG 24 hr capsule Take 2 caps in the morning (50 mg) and Take one capsule (25 mg) in the afternoon 11/17/19 12/15/19  Roselind Messier, MD  ADDERALL XR 25 MG 24 hr capsule Take 2 cap in morning (50 mg) and one (25 mg) in afternoon 10/17/19 11/17/19  Roselind Messier, MD  ADDERALL XR 25 MG 24 hr capsule Take 2 cap in morning (50 mg) and one (25 mg ) at 3 pm. 09/17/19 10/17/19  Roselind Messier, MD  cetirizine (ZYRTEC) 10 MG tablet Take 1 tablet (10 mg total) by mouth daily. 12/28/18   Roselind Messier, MD  fluticasone (FLONASE) 50 MCG/ACT nasal spray Place 1 spray into both nostrils daily. 12/28/18   Roselind Messier, MD  methocarbamol (ROBAXIN) 500 MG tablet Take 1 tablet (500 mg total) by mouth 2 (two) times daily. 01/29/20   Dashonda Bonneau, Kathleene Hazel, PA  montelukast (SINGULAIR) 10 MG tablet Take 1 tablet (10 mg total) by mouth at bedtime. 12/28/18   Roselind Messier, MD  predniSONE (STERAPRED UNI-PAK 21 TAB) 10 MG (21) TBPK tablet Take by mouth daily. Take 6 tabs by mouth daily  for 2 days, then 5 tabs for 2 days,  then 4 tabs for 2 days, then 3 tabs for 2 days, 2 tabs for 2 days, then 1 tab by mouth daily for 2 days 01/29/20   Tedd Sias, PA    Allergies    Patient has no known allergies.  Review of Systems   Review of Systems  Constitutional: Negative for chills and fever.  HENT: Negative for congestion.   Respiratory: Negative for shortness of breath.   Cardiovascular: Negative for chest pain.  Gastrointestinal: Negative for abdominal pain.  Musculoskeletal: Positive for back pain. Negative for neck pain.  Neurological: Negative for numbness.    Physical Exam Updated Vital Signs BP 128/89 (BP Location: Right Arm)   Pulse 78   Temp  98.9 F (37.2 C)   Resp 18   Ht 5\' 9"  (1.753 m)   Wt 85.3 kg   SpO2 100%   BMI 27.76 kg/m   Physical Exam Vitals and nursing note reviewed.  Constitutional:      General: He is not in acute distress.    Appearance: Normal appearance. He is not ill-appearing.  HENT:     Head: Normocephalic and atraumatic.     Mouth/Throat:     Mouth: Mucous membranes are moist.  Eyes:     General: No scleral icterus.       Right eye: No discharge.        Left eye: No discharge.     Conjunctiva/sclera: Conjunctivae normal.  Pulmonary:     Effort: Pulmonary effort is normal.     Breath sounds: No stridor.  Musculoskeletal:        General: Normal range of motion.     Comments: Midline tenderness to palpation of the thoracic spine approximately T7/T8.  There is also diffuse lumbar midline tenderness to palpation.  Some parathoracic and paralumbar muscular tenderness which seems to be less focal than the bony tenderness.  Strength in upper and lower extremities 5/5 and symmetric.  Skin:    General: Skin is warm and dry.  Neurological:     Mental Status: He is alert and oriented to person, place, and time. Mental status is at baseline.     Comments: No sensory deficits of lower or upper extremities.  Reflexes intact BL patella      ED Results / Procedures / Treatments   Labs (all labs ordered are listed, but only abnormal results are displayed) Labs Reviewed - No data to display  EKG None  Radiology DG Thoracic Spine 2 View  Result Date: 01/29/2020 CLINICAL DATA:  Fall 1 hour ago with upper back pain, initial encounter EXAM: THORACIC SPINE 2 VIEWS COMPARISON:  07/17/2018 FINDINGS: There is no evidence of thoracic spine fracture. Alignment is normal. No other significant bone abnormalities are identified. IMPRESSION: No acute abnormality noted. Electronically Signed   By: Inez Catalina M.D.   On: 01/29/2020 19:10   CT Thoracic Spine Wo Contrast  Result Date: 01/29/2020 CLINICAL DATA:   Initial evaluation for acute mid back pain, concern for disc herniation. EXAM: CT THORACIC AND LUMBAR SPINE WITHOUT CONTRAST TECHNIQUE: Multidetector CT imaging of the thoracic and lumbar spine was performed without contrast. Multiplanar CT image reconstructions were also generated. COMPARISON:  Prior radiograph from earlier same day. FINDINGS: CT THORACIC SPINE FINDINGS Alignment: Mild levoscoliosis. Alignment otherwise normal preservation of the normal thoracic kyphosis. Vertebrae: Vertebral body height maintained without evidence for acute or chronic fracture. No discrete lytic or blastic osseous lesions. Paraspinal and other soft tissues: Paraspinous soft tissues within normal limits.  Partially visualized lungs are grossly clear. 16 mm exophytic hyperdense lesion seen extending from the upper pole of the left kidney (series 6, image 131), indeterminate. Remainder the visualized visceral structures otherwise unremarkable. Disc levels: T6-7: Right paracentral disc protrusion indents the ventral thecal sac. No definite stenosis. T7-8: Small central disc protrusion indents the ventral thecal sac. No definite stenosis. T8-9: Central to left paracentral disc protrusion indents the left ventral thecal sac. No definite stenosis. T9-10: Central to right paracentral disc protrusion indents the ventral thecal sac. No definite stenosis. No other definite disc pathology seen elsewhere within the thoracic spine. No appreciable canal or foraminal stenosis. CT LUMBAR SPINE FINDINGS Segmentation: Standard. Lowest well-formed disc space labeled the L5-S1 level. Alignment: Mild levoscoliosis. Alignment otherwise normal with preservation of the normal lumbar lordosis. No listhesis. Vertebrae: Vertebral body height maintained without evidence for acute or chronic fracture. Visualized sacrum and pelvis intact. No discrete lytic or blastic osseous lesions. Paraspinal and other soft tissues: Paraspinous soft tissues within normal  limits. Exophytic hyperdense lesion again noted extending from the upper pole the left kidney. Visualized visceral structures otherwise unremarkable. Disc levels: L1-2:  Unremarkable. L2-3:  Unremarkable. L3-4:  Unremarkable. L4-5: Shallow right foraminal disc closely approximates and/or contacts the exiting right L4 nerve root (series 5, image 95). No significant spinal stenosis. Mild bilateral L4 foraminal narrowing. L5-S1: Shallow posterior disc bulge. Bulging disc closely approximates the descending S1 nerve roots without definite neural impingement or displacement. No significant spinal stenosis. Foramina remain patent. IMPRESSION: CT THORACIC SPINE IMPRESSION 1. Multifocal disc protrusions extending from T6-7 through T9-10 as above. No significant spinal stenosis. Findings could contribute to underlying mid back pain. 2. Underlying mild levoscoliosis. 3. 16 mm hyperdense exophytic lesion extending from the upper pole of the left kidney, indeterminate. Further assessment with dedicated renal ultrasound recommended for further characterization. CT LUMBAR SPINE IMPRESSION 1. Small right foraminal disc protrusion at L4-5, closely approximating and potentially affecting the exiting right L4 nerve root. 2. Shallow posterior disc bulge at L5-S1, closely approximating the descending S1 nerve roots without frank neural impingement. Electronically Signed   By: Jeannine Boga M.D.   On: 01/29/2020 23:14   CT Lumbar Spine Wo Contrast  Result Date: 01/29/2020 CLINICAL DATA:  Initial evaluation for acute mid back pain, concern for disc herniation. EXAM: CT THORACIC AND LUMBAR SPINE WITHOUT CONTRAST TECHNIQUE: Multidetector CT imaging of the thoracic and lumbar spine was performed without contrast. Multiplanar CT image reconstructions were also generated. COMPARISON:  Prior radiograph from earlier same day. FINDINGS: CT THORACIC SPINE FINDINGS Alignment: Mild levoscoliosis. Alignment otherwise normal preservation  of the normal thoracic kyphosis. Vertebrae: Vertebral body height maintained without evidence for acute or chronic fracture. No discrete lytic or blastic osseous lesions. Paraspinal and other soft tissues: Paraspinous soft tissues within normal limits. Partially visualized lungs are grossly clear. 16 mm exophytic hyperdense lesion seen extending from the upper pole of the left kidney (series 6, image 131), indeterminate. Remainder the visualized visceral structures otherwise unremarkable. Disc levels: T6-7: Right paracentral disc protrusion indents the ventral thecal sac. No definite stenosis. T7-8: Small central disc protrusion indents the ventral thecal sac. No definite stenosis. T8-9: Central to left paracentral disc protrusion indents the left ventral thecal sac. No definite stenosis. T9-10: Central to right paracentral disc protrusion indents the ventral thecal sac. No definite stenosis. No other definite disc pathology seen elsewhere within the thoracic spine. No appreciable canal or foraminal stenosis. CT LUMBAR SPINE FINDINGS Segmentation: Standard. Lowest well-formed disc space  labeled the L5-S1 level. Alignment: Mild levoscoliosis. Alignment otherwise normal with preservation of the normal lumbar lordosis. No listhesis. Vertebrae: Vertebral body height maintained without evidence for acute or chronic fracture. Visualized sacrum and pelvis intact. No discrete lytic or blastic osseous lesions. Paraspinal and other soft tissues: Paraspinous soft tissues within normal limits. Exophytic hyperdense lesion again noted extending from the upper pole the left kidney. Visualized visceral structures otherwise unremarkable. Disc levels: L1-2:  Unremarkable. L2-3:  Unremarkable. L3-4:  Unremarkable. L4-5: Shallow right foraminal disc closely approximates and/or contacts the exiting right L4 nerve root (series 5, image 95). No significant spinal stenosis. Mild bilateral L4 foraminal narrowing. L5-S1: Shallow posterior  disc bulge. Bulging disc closely approximates the descending S1 nerve roots without definite neural impingement or displacement. No significant spinal stenosis. Foramina remain patent. IMPRESSION: CT THORACIC SPINE IMPRESSION 1. Multifocal disc protrusions extending from T6-7 through T9-10 as above. No significant spinal stenosis. Findings could contribute to underlying mid back pain. 2. Underlying mild levoscoliosis. 3. 16 mm hyperdense exophytic lesion extending from the upper pole of the left kidney, indeterminate. Further assessment with dedicated renal ultrasound recommended for further characterization. CT LUMBAR SPINE IMPRESSION 1. Small right foraminal disc protrusion at L4-5, closely approximating and potentially affecting the exiting right L4 nerve root. 2. Shallow posterior disc bulge at L5-S1, closely approximating the descending S1 nerve roots without frank neural impingement. Electronically Signed   By: Jeannine Boga M.D.   On: 01/29/2020 23:14    Procedures Procedures (including critical care time)  Medications Ordered in ED Medications  oxyCODONE-acetaminophen (PERCOCET/ROXICET) 5-325 MG per tablet 1 tablet (1 tablet Oral Given 01/29/20 2213)  ketorolac (TORADOL) injection 30 mg (30 mg Intramuscular Given 01/29/20 2213)    ED Course  I have reviewed the triage vital signs and the nursing notes.  Pertinent labs & imaging results that were available during my care of the patient were reviewed by me and considered in my medical decision making (see chart for details).    MDM Rules/Calculators/A&P                      Broad differential for back pain considered includes malignancy, disc herniation, spinal epidural abscess, spinal fracture, cauda equina, pyelonephritis, kidney stone, AAA, AD, pancreatitis, PE and PTX.   History without symptoms of urinary or stool retention or incontinence, neurologic changes such as sensation change or weakness lower extremities, coagulopathy  or blood thinner use, is not elderly or with history of osteoporosis, denies any history of cancer, fever, IV drug use, weight changes (unexplained), or prolonged steroid use.   Physical exam is most concerning for disc herniation versus muscle spasm.  Doubt cauda equina d/t lack of saddle anesthesia/bowel or bladder incontinence or urinary retention, normal gait and reassuring physical examination without neurologic deficits.   History is not supportive of kidney stone, AAA, AD, pancreatitis, PE or PTX. Patient has no CVA tenderness or urinary sx to suggest pyelonephritis or kidney stone.   CT scan shows the following impression: CT THORACIC SPINE IMPRESSION    1. Multifocal disc protrusions extending from T6-7 through T9-10 as  above. No significant spinal stenosis. Findings could contribute to  underlying mid back pain.  2. Underlying mild levoscoliosis.  3. 16 mm hyperdense exophytic lesion extending from the upper pole  of the left kidney, indeterminate. Further assessment with dedicated  renal ultrasound recommended for further characterization.    CT LUMBAR SPINE IMPRESSION    1. Small right foraminal disc  protrusion at L4-5, closely  approximating and potentially affecting the exiting right L4 nerve  root.  2. Shallow posterior disc bulge at L5-S1, closely approximating the  descending S1 nerve roots without frank neural impingement.   Patient is neurologically intact and states that his symptoms are significantly improved at this time.  He has received Toradol IM and 1 dose of Percocet.  He is ambulatory.  He does not have any sensory changes and has good reflexes.  Will discharge with prednisone taper and Robaxin and recommend follow-up with Dr. Raeford Razor  Will manage patient conservatively at this time. NSAIDs, back exercises/stretches, heat therapy and follow up with PCP if symptoms do not resolve in 3-4 weeks. Patient offered muscle relaxer for comfort at night. Counseled on  need to return to ED for fever, worsening or concerning symptoms. Patient agreeable to plan and states understanding of follow up plans and return precautions.   Vital signs are within normal limits during initial assessment apart from mildly elevated blood pressure which resolved after treatment of pain.  Vital signs are within normal limits at time of discharge.  Patient is denying any saddle anesthesia.  Final Clinical Impression(s) / ED Diagnoses Final diagnoses:  Bulging of thoracic intervertebral disc  Bulging lumbar disc    Rx / DC Orders ED Discharge Orders         Ordered    methocarbamol (ROBAXIN) 500 MG tablet  2 times daily     01/29/20 2321    predniSONE (STERAPRED UNI-PAK 21 TAB) 10 MG (21) TBPK tablet  Daily     01/29/20 2321           Pati Gallo [place], [place] 01/30/20 0857    Fredia Sorrow, MD 02/01/20 0930

## 2020-01-29 NOTE — Discharge Instructions (Addendum)
You are found to have multiple herniated disks.  Please follow-up with Dr. Raeford Razor sports medicine for this.  You are also found to have a small abnormality in your kidney which I have low suspicion for causing any of the symptoms today however it is recommended that you have a follow-up ultrasound.  Please follow-up with your primary care doctor for this. Please use Tylenol or ibuprofen for pain.  You may use 600 mg ibuprofen every 6 hours or 1000 mg of Tylenol every 6 hours.  You may choose to alternate between the 2.  This would be most effective.  Not to exceed 4 g of Tylenol within 24 hours.  Not to exceed 3200 mg ibuprofen 24 hours.

## 2020-01-29 NOTE — ED Triage Notes (Signed)
Pt c/o upper back pain/injury  X 1 hr ago

## 2020-02-05 ENCOUNTER — Encounter: Payer: Self-pay | Admitting: Family Medicine

## 2020-02-05 ENCOUNTER — Other Ambulatory Visit: Payer: Self-pay

## 2020-02-05 ENCOUNTER — Ambulatory Visit: Payer: Medicaid Other | Admitting: Family Medicine

## 2020-02-05 DIAGNOSIS — M546 Pain in thoracic spine: Secondary | ICD-10-CM

## 2020-02-05 NOTE — Patient Instructions (Signed)
Nice to meet you Please try the exercises  Please try compression  Please try heat  Please try a thera cane or lacrosse ball to massage the area  Please try ibuprofen after finishing the prednisone   Please send me a message in [place] with any questions or updates.  Please see me back in 4 weeks.   --Dr. Raeford Razor

## 2020-02-05 NOTE — Assessment & Plan Note (Signed)
Pain seems more muscle spasm related on this twisting motion while he was working.  CT was showing different changes but not likely acutely different.  He has had improvement with medications thus far. -Rib belt. -Can take ibuprofen after finishing prednisone. -Counseled on home exercise therapy and supportive care. -If pain is ongoing can consider trigger point injections, further imaging or physical therapy.

## 2020-02-05 NOTE — Progress Notes (Signed)
Michael Esparza - 24 y.o. male MRN [id number]  Date of birth: 1996/02/07  SUBJECTIVE:  Including CC & ROS.  Chief Complaint  Patient presents with  . Back Injury    left-sided upper x 1 week    Michael Esparza is a 24 y.o. male that is presenting with acute left-sided mid back pain.  He was working and felt a pop in the middle of his back.  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  He has had improvement with medications.  No history of similar pain.  No history of surgery.  No radicular symptoms..  Independent review of the CT lumbar spine from 4/28 shows mild disc narrowing at L5-S1.  Independent review of the thoracic CT scan from 4/28 shows protrusions at T6/7 through T9/10.  No stenosis.  Mild levoscoliosis.   Review of Systems See HPI   HISTORY: Past Medical, Surgical, Social, and Family History Reviewed & Updated per EMR.   Pertinent Historical Findings include:  Past Medical History:  Diagnosis Date  . ADHD (attention deficit hyperactivity disorder)   . Asperger's syndrome   . Fracture of left hand 2014   laceration, hedge clipping  . Post concussion syndrome 07/21/2014    Past Surgical History:  Procedure Laterality Date  . OPEN REDUCTION INTERNAL FIXATION (ORIF) HAND Left 12/21/2015   Procedure: OPEN REDUCTION INTERNAL FIXATION (ORIF) HAND, NERVE AND TENDON REPAIR;  Surgeon: Milly Jakob, MD;  Location: [facility];  Service: Orthopedics;  Laterality: Left;    Family History  Problem Relation Age of Onset  . Hypertension Mother   . Arthritis Father 35       anklosing spondylosis  . Hyperlipidemia Maternal Grandmother   . Diabetes Maternal Grandfather     Social History   Socioeconomic History  . Marital status: Single    Spouse name: Not on file  . Number of children: Not on file  . Years of education: Not on file  . Highest education level: Not on file  Occupational History  . Not on file  Tobacco Use  . Smoking status: Never Smoker  .  Smokeless tobacco: Never Used  Substance and Sexual Activity  . Alcohol use: No  . Drug use: No  . Sexual activity: Not on file  Other Topics Concern  . Not on file  Social History Narrative  . Not on file   Social Determinants of Health   Financial Resource Strain:   . Difficulty of Paying Living Expenses:   Food Insecurity:   . Worried About Charity fundraiser in the Last Year:   . Arboriculturist in the Last Year:   Transportation Needs:   . Film/video editor (Medical):   Marland Kitchen Lack of Transportation (Non-Medical):   Physical Activity:   . Days of Exercise per Week:   . Minutes of Exercise per Session:   Stress:   . Feeling of Stress :   Social Connections:   . Frequency of Communication with Friends and Family:   . Frequency of Social Gatherings with Friends and Family:   . Attends Religious Services:   . Active Member of Clubs or Organizations:   . Attends Archivist Meetings:   Marland Kitchen Marital Status:   Intimate Partner Violence:   . Fear of Current or Ex-Partner:   . Emotionally Abused:   Marland Kitchen Physically Abused:   . Sexually Abused:      PHYSICAL EXAM:  VS: BP (!) 146/88  Pulse 89   Ht 5\' 10"  (1.778 m)   Wt 180 lb (81.6 kg)   BMI 25.83 kg/m  Physical Exam Gen: NAD, alert, cooperative with exam, well-appearing MSK:  Back: Tenderness to palpation over the left paraspinal thoracic muscle on the left. No midline tenderness of the thoracic spine or lumbar spine. Normal flexion and extension. Normal sidebend. Normal twist. No winging of the scapula. Normal strength in lower extremity. Neurovascularly intact     ASSESSMENT & PLAN:   Thoracic back pain Pain seems more muscle spasm related on this twisting motion while he was working.  CT was showing different changes but not likely acutely different.  He has had improvement with medications thus far. -Rib belt. -Can take ibuprofen after finishing prednisone. -Counseled on home exercise therapy  and supportive care. -If pain is ongoing can consider trigger point injections, further imaging or physical therapy.

## 2020-02-14 ENCOUNTER — Encounter: Payer: Self-pay | Admitting: Endocrinology

## 2020-03-04 ENCOUNTER — Ambulatory Visit: Payer: Medicaid Other | Admitting: Family Medicine

## 2020-03-04 NOTE — Progress Notes (Deleted)
  Michael Esparza - 24 y.o. male MRN [id number]  Date of birth: September 29, 1996  SUBJECTIVE:  Including CC & ROS.  No chief complaint on file.   Michael Esparza is a 24 y.o. male that is  ***.  ***   Review of Systems See HPI   HISTORY: Past Medical, Surgical, Social, and Family History Reviewed & Updated per EMR.   Pertinent Historical Findings include:  Past Medical History:  Diagnosis Date  . ADHD (attention deficit hyperactivity disorder)   . Asperger's syndrome   . Fracture of left hand 2014   laceration, hedge clipping  . Post concussion syndrome 07/21/2014    Past Surgical History:  Procedure Laterality Date  . OPEN REDUCTION INTERNAL FIXATION (ORIF) HAND Left 12/21/2015   Procedure: OPEN REDUCTION INTERNAL FIXATION (ORIF) HAND, NERVE AND TENDON REPAIR;  Surgeon: Milly Jakob, MD;  Location: [facility];  Service: Orthopedics;  Laterality: Left;    Family History  Problem Relation Age of Onset  . Hypertension Mother   . Arthritis Father 31       anklosing spondylosis  . Hyperlipidemia Maternal Grandmother   . Diabetes Maternal Grandfather     Social History   Socioeconomic History  . Marital status: Single    Spouse name: Not on file  . Number of children: Not on file  . Years of education: Not on file  . Highest education level: Not on file  Occupational History  . Not on file  Tobacco Use  . Smoking status: Never Smoker  . Smokeless tobacco: Never Used  Substance and Sexual Activity  . Alcohol use: No  . Drug use: No  . Sexual activity: Not on file  Other Topics Concern  . Not on file  Social History Narrative  . Not on file   Social Determinants of Health   Financial Resource Strain:   . Difficulty of Paying Living Expenses:   Food Insecurity:   . Worried About Charity fundraiser in the Last Year:   . Arboriculturist in the Last Year:   Transportation Needs:   . Film/video editor (Medical):   Marland Kitchen Lack of Transportation (Non-Medical):   Physical  Activity:   . Days of Exercise per Week:   . Minutes of Exercise per Session:   Stress:   . Feeling of Stress :   Social Connections:   . Frequency of Communication with Friends and Family:   . Frequency of Social Gatherings with Friends and Family:   . Attends Religious Services:   . Active Member of Clubs or Organizations:   . Attends Archivist Meetings:   Marland Kitchen Marital Status:   Intimate Partner Violence:   . Fear of Current or Ex-Partner:   . Emotionally Abused:   Marland Kitchen Physically Abused:   . Sexually Abused:      PHYSICAL EXAM:  VS: There were no vitals taken for this visit. Physical Exam Gen: NAD, alert, cooperative with exam, well-appearing MSK:  ***      ASSESSMENT & PLAN:   No problem-specific Assessment & Plan notes found for this encounter.

## 2020-06-18 ENCOUNTER — Other Ambulatory Visit: Payer: Self-pay | Admitting: Pediatrics

## 2020-06-18 DIAGNOSIS — J301 Allergic rhinitis due to pollen: Secondary | ICD-10-CM

## 2020-08-06 ENCOUNTER — Other Ambulatory Visit (HOSPITAL_COMMUNITY): Payer: Self-pay | Admitting: Urology

## 2020-08-06 ENCOUNTER — Other Ambulatory Visit: Payer: Self-pay | Admitting: Urology

## 2020-08-06 DIAGNOSIS — D4102 Neoplasm of uncertain behavior of left kidney: Secondary | ICD-10-CM

## 2020-08-14 ENCOUNTER — Other Ambulatory Visit: Payer: Self-pay

## 2020-08-14 ENCOUNTER — Ambulatory Visit (HOSPITAL_COMMUNITY)
Admission: RE | Admit: 2020-08-14 | Discharge: 2020-08-14 | Disposition: A | Discharge status: Home / Self Care | Payer: Medicaid Other | Source: Ambulatory Visit | Attending: Urology | Admitting: Urology

## 2020-08-14 DIAGNOSIS — D4102 Neoplasm of uncertain behavior of left kidney: Secondary | ICD-10-CM | POA: Insufficient documentation

## 2020-08-14 MED ORDER — GADOBUTROL 1 MMOL/ML IV SOLN
8.0000 mL | Freq: Once | INTRAVENOUS | Status: AC | PRN
  Administered 2020-08-14: 8 mL via INTRAVENOUS

## 2023-01-16 ENCOUNTER — Encounter: Payer: Self-pay | Admitting: *Deleted

## 2023-07-25 ENCOUNTER — Emergency Department (HOSPITAL_BASED_OUTPATIENT_CLINIC_OR_DEPARTMENT_OTHER): Payer: MEDICAID | Admitting: Radiology

## 2023-07-25 ENCOUNTER — Encounter (HOSPITAL_BASED_OUTPATIENT_CLINIC_OR_DEPARTMENT_OTHER): Payer: Self-pay | Admitting: Emergency Medicine

## 2023-07-25 DIAGNOSIS — Z5321 Procedure and treatment not carried out due to patient leaving prior to being seen by health care provider: Secondary | ICD-10-CM | POA: Diagnosis not present

## 2023-07-25 DIAGNOSIS — R059 Cough, unspecified: Secondary | ICD-10-CM | POA: Insufficient documentation

## 2023-07-25 DIAGNOSIS — R519 Headache, unspecified: Secondary | ICD-10-CM | POA: Insufficient documentation

## 2023-07-25 NOTE — ED Triage Notes (Signed)
Working was caught under water by a tree limb for 1.58mins.  Reports coughing and headache since. Happened at 9AM this morning

## 2023-07-26 ENCOUNTER — Emergency Department (HOSPITAL_BASED_OUTPATIENT_CLINIC_OR_DEPARTMENT_OTHER)
Admission: EM | Admit: 2023-07-26 | Discharge: 2023-07-26 | Discharge status: Against Medical Advice | Payer: MEDICAID | Attending: Emergency Medicine | Admitting: Emergency Medicine

## 2023-07-26 NOTE — ED Notes (Signed)
Pt has left per registration staff

## 2023-10-13 ENCOUNTER — Ambulatory Visit: Payer: BC Managed Care – PPO | Admitting: Physician Assistant

## 2023-10-13 ENCOUNTER — Encounter: Payer: Self-pay | Admitting: Physician Assistant

## 2023-10-13 VITALS — BP 124/78 | HR 77 | Temp 97.0°F | Ht 70.47 in | Wt 200.6 lb

## 2023-10-13 DIAGNOSIS — M545 Low back pain, unspecified: Secondary | ICD-10-CM

## 2023-10-13 DIAGNOSIS — J4520 Mild intermittent asthma, uncomplicated: Secondary | ICD-10-CM | POA: Diagnosis not present

## 2023-10-13 DIAGNOSIS — F902 Attention-deficit hyperactivity disorder, combined type: Secondary | ICD-10-CM | POA: Diagnosis not present

## 2023-10-13 MED ORDER — AMPHETAMINE-DEXTROAMPHET ER 20 MG PO CP24: 20.0000 mg | ORAL_CAPSULE | Freq: Every day | ORAL | 0 refills | Status: DC

## 2023-10-13 NOTE — Patient Instructions (Addendum)
 Please call [facility] at Phone: [phone]. Let me know if any issues scheduling / or with insurance at their office.    YOUR PLAN:  -ADHD: ADHD is a condition that affects focus and behavior. You have been managing it with Adderall , but due to practice limitations, we will help you find another provider within our practice or refer you to [facility]. In the meantime, you will receive an interim prescription for Adderall .  -DISC DISEASE: Disc disease involves damage to the discs in your spine, causing pain. You have a history of eight herniated discs from a work injury. We will refer you to Sports Medicine for further evaluation and potential adjustments to help manage your pain.  -ASTHMA: Asthma is a condition that affects your airways and breathing. You are currently managing it with an inhaler, and we recommend continuing this treatment.

## 2023-10-13 NOTE — Progress Notes (Signed)
 Patient ID: Michael Esparza, male    DOB: 09-Sep-1996, 28 y.o.   MRN: [id number]   Assessment & Plan:  Attention deficit hyperactivity disorder (ADHD), combined type -     Ambulatory referral to Psychiatry  Lumbar pain -     Ambulatory referral to Sports Medicine  Mild intermittent asthma without complication  Other orders -     Amphetamine -Dextroamphet ER; Take 1 capsule (20 mg total) by mouth daily.  Dispense: 30 capsule; Refill: 0   Assessment and Plan    ADHD Longstanding diagnosis since childhood, currently managed with Adderall . Noted functional impairment without medication per patient. Provider unable to continue management due to patient cap. -Seeking within practice for another provider to manage ADHD medication. -If unable to find provider within practice, refer to [facility]. -Provided 30 days Refill today of Adderall  XR 20 mg daily, as PDMP was reviewed and he is due for refill, has been consistent with use.  Disc Disease History of 8 herniated discs from work-related injury. No surgical intervention, managed with occasional pain medication. Reports ongoing discomfort and occasional severe pain. -Referral to Sports Medicine for evaluation and potential adjustments.  Asthma Reports occasional symptoms, managed with Wixela inhaler.  -Continue current management     Return if symptoms worsen or fail to improve.    Subjective:    Chief Complaint  Patient presents with   New Patient (Initial Visit)    New Pt in office to establish care with PCP; pt states he was dx with high functioning autism and ADHD and needs to have prescriptions filled today. Was previously managed by former PCP and took last pill today.     HPI Discussed the use of AI scribe software for clinical note transcription with the patient, who gave verbal consent to proceed.  History of Present Illness   Michael Esparza, a local resident with a long-standing diagnosis of ADHD,  presents to establish care. He reports that his ADHD symptoms are well controlled with Adderall , which he has been taking since childhood. Without it, he struggles with focus and quick thinking, which is essential for his physically demanding job as a designer, industrial/product. He has been on the same dose for a long time, but recently it was reduced to 20mg . He has been rationing his current supply due to a shortage, taking it only on days when he absolutely needs it, but he expresses a need for daily use.  In addition to ADHD, he has occasional asthma, which is managed with an inhaler. He also reports a history of back pain due to a previous accident at work that resulted in eight herniated discs. He has been managing the pain with over-the-counter medication and has not sought chiropractic care, but he expresses interest in doing so as the pain recurs from time to time.   Past Medical History:  Diagnosis Date   ADHD (attention deficit hyperactivity disorder)    Anxiety    Asperger's syndrome    Disc degeneration, lumbar    accident during work (tree cutting)   Fracture of left hand 10/03/2012   laceration, hedge clipping   GERD (gastroesophageal reflux disease)    High-functioning autism spectrum disorder    Post concussion syndrome 07/21/2014    Past Surgical History:  Procedure Laterality Date   FRACTURE SURGERY     OPEN REDUCTION INTERNAL FIXATION (ORIF) HAND Left 12/21/2015   Procedure: OPEN REDUCTION INTERNAL FIXATION (ORIF) HAND, NERVE AND TENDON REPAIR;  Surgeon: Alm Hummer, MD;  Location: [facility];  Service: Orthopedics;  Laterality: Left;    Family History  Problem Relation Age of Onset   Hypertension Mother    Arthritis Father 66       anklosing spondylosis   Ankylosing spondylitis Father    ADD / ADHD Brother    Hyperlipidemia Maternal Grandmother    Parkinson's disease Maternal Grandmother    Diabetes Maternal Grandfather    Cancer Paternal Grandfather     Social History    Tobacco Use   Smoking status: Never   Smokeless tobacco: Never  Vaping Use   Vaping status: Never Used  Substance Use Topics   Alcohol use: No   Drug use: No     No Known Allergies  Review of Systems NEGATIVE UNLESS OTHERWISE INDICATED IN HPI      Objective:     BP 124/78 (BP Location: Left Arm, Patient Position: Sitting)   Pulse 77   Temp (!) 97 F (36.1 C) (Temporal)   Ht 5' 10.47 (1.79 m)   Wt 200 lb 9.6 oz (91 kg)   SpO2 97%   BMI 28.40 kg/m   Wt Readings from Last 3 Encounters:  10/13/23 200 lb 9.6 oz (91 kg)  02/05/20 180 lb (81.6 kg)  01/29/20 188 lb (85.3 kg)    BP Readings from Last 3 Encounters:  10/13/23 124/78  07/25/23 (!) 118/104  02/05/20 (!) 146/88     Physical Exam Vitals and nursing note reviewed.  Constitutional:      Appearance: Normal appearance.  Eyes:     Extraocular Movements: Extraocular movements intact.     Conjunctiva/sclera: Conjunctivae normal.     Pupils: Pupils are equal, round, and reactive to light.  Cardiovascular:     Rate and Rhythm: Normal rate and regular rhythm.     Pulses: Normal pulses.     Heart sounds: Normal heart sounds.  Pulmonary:     Effort: Pulmonary effort is normal.     Breath sounds: Normal breath sounds.  Neurological:     General: No focal deficit present.     Mental Status: He is alert and oriented to person, place, and time.  Psychiatric:        Mood and Affect: Mood normal.        Behavior: Behavior normal.        Marlaina Coburn M Takiesha Mcdevitt, PA-C

## 2023-10-20 NOTE — Progress Notes (Unsigned)
[name] [address] [place] [address] Phone: [phone]   Assessment and Plan:    1. Chronic bilateral low back pain without sciatica 2. Somatic dysfunction of thoracic region 3. Somatic dysfunction of lumbar region 4. Somatic dysfunction of pelvic region - Chronic with exacerbation, initial sports medicine visit - Most consistent with chronic muscular dysfunction in low back due to patient's physical work - X-ray obtained in clinic.  My interpretation: No acute fracture or vertebral collapse.  Questionable spondylolysis at right-sided L3.  Will await formal radiology report.  If present, I do not believe it represents an acute injury based on HPI - Start meloxicam 15 mg daily x2 weeks.  If still having pain after 2 weeks, complete 3rd-week of NSAID. May use remaining NSAID as needed once daily for pain control.  Do not to use additional over-the-counter NSAIDs (ibuprofen, naproxen, Advil, Aleve) while taking prescription NSAIDs.  May use Tylenol [phone] mg 2 to 3 times a day for breakthrough pain. - Start HEP and physical therapy - Patient elected for initial OMT today.  Tolerated well per note below. - Decision today to treat with OMT was based on Physical Exam  After verbal consent patient was treated with HVLA (high velocity low amplitude), ME (muscle energy), FPR (flex positional release), ST (soft tissue), PC/PD (Pelvic Compression/ Pelvic Decompression) techniques in  thoracic, lumbar, and pelvic areas. Patient tolerated the procedure well with improvement in symptoms.  Patient educated on potential side effects of soreness and recommended to rest, hydrate, and use Tylenol as needed for pain control.   15 additional minutes spent for educating Therapeutic Home Exercise Program.  This included exercises focusing on stretching, strengthening, with focus on eccentric aspects.   Long term goals include an improvement in range  of motion, strength, endurance as well as avoiding reinjury. Patient's frequency would include in 1-2 times a day, 3-5 times a week for a duration of 6-12 weeks. Proper technique shown and discussed handout in great detail with ATC.  All questions were discussed and answered.     Pertinent previous records reviewed include family medicine note 10/13/2023  Follow Up: 4 weeks for reevaluation.  Could consider repeat OMT if patient benefited from today's treatment   Subjective:   I, Nakya Weyand, am serving as a Neurosurgeon for Doctor Richardean Sale  Chief Complaint: low back pain   HPI:   10/23/2023 Patient is a 28 year old male with low back pain. 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 Pain radiates up the back when he lifts heavy. PRN meds. Low back numbness that is more frequent. He isnt able to lay flat on the floor. He had a pop that relieved his pain a couple of weeks ago    Relevant Historical Information: None pertinent  Additional pertinent review of systems negative.   Current Outpatient Medications:    amphetamine-dextroamphetamine (ADDERALL XR) 20 MG 24 hr capsule, Take 1 capsule (20 mg total) by mouth daily., Disp: 30 capsule, Rfl: 0   cetirizine (ZYRTEC) 10 MG tablet, Take 1 tablet (10 mg total) by mouth daily., Disp: 30 tablet, Rfl: 6   fluticasone (FLONASE) 50 MCG/ACT nasal spray, Place 1 spray into both nostrils daily., Disp: 16 g, Rfl: 6   meloxicam (MOBIC) 15 MG tablet, Take 1 tablet (15 mg total) by mouth daily., Disp: 30 tablet, Rfl: 0   WIXELA INHUB 250-50 MCG/ACT  AEPB, Inhale 1 puff into the lungs in the morning and at bedtime., Disp: , Rfl:    Objective:     Vitals:   10/23/23 0833  BP: 120/84  Pulse: 98  SpO2: 100%  Weight: 206 lb (93.4 kg)  Height: 5\' 10"  (1.778 m)      Body mass index is 29.56 kg/m.    Physical Exam:    Gen: Appears well, nad, nontoxic and pleasant Psych: Alert and oriented,  appropriate mood and affect Neuro: sensation intact, strength is 5/5 in upper and lower extremities, muscle tone wnl Skin: no susupicious lesions or rashes  Back - Normal skin, Spine with normal alignment and no deformity.   Mild tenderness to vertebral process palpation.   Bilateral lumbar paraspinous muscles are mildly tender and without spasm NTTP gluteal musculature Straight leg raise negative Trendelenberg reproduced pain bilaterally Piriformis Test negative Gait normal  Low back pain with lumbar extension and right rotation.  No pain with flexion or left rotation    OMT Physical Exam:  ASIS Compression Test: Positive Right  Thoracic: TTP paraspinal, T7-9 RRSL Lumbar: TTP paraspinal, L1-3 RRSL, L5 RL Pelvis: Right anterior innominate   Electronically signed by:  [name] 9:12 AM 10/23/23

## 2023-10-23 ENCOUNTER — Ambulatory Visit (INDEPENDENT_AMBULATORY_CARE_PROVIDER_SITE_OTHER): Payer: BC Managed Care – PPO | Admitting: Sports Medicine

## 2023-10-23 ENCOUNTER — Encounter: Payer: Self-pay | Admitting: Sports Medicine

## 2023-10-23 ENCOUNTER — Ambulatory Visit (INDEPENDENT_AMBULATORY_CARE_PROVIDER_SITE_OTHER): Payer: BC Managed Care – PPO

## 2023-10-23 VITALS — BP 120/84 | HR 98 | Ht 70.0 in | Wt 206.0 lb

## 2023-10-23 DIAGNOSIS — G8929 Other chronic pain: Secondary | ICD-10-CM

## 2023-10-23 DIAGNOSIS — M9903 Segmental and somatic dysfunction of lumbar region: Secondary | ICD-10-CM

## 2023-10-23 DIAGNOSIS — M9902 Segmental and somatic dysfunction of thoracic region: Secondary | ICD-10-CM | POA: Diagnosis not present

## 2023-10-23 DIAGNOSIS — M9905 Segmental and somatic dysfunction of pelvic region: Secondary | ICD-10-CM | POA: Diagnosis not present

## 2023-10-23 DIAGNOSIS — M545 Low back pain, unspecified: Secondary | ICD-10-CM

## 2023-10-23 MED ORDER — MELOXICAM 15 MG PO TABS: 15.0000 mg | ORAL_TABLET | Freq: Every day | ORAL | 0 refills | Status: DC

## 2023-10-23 NOTE — Patient Instructions (Addendum)
Low back HEP  - Start meloxicam 15 mg daily x2 weeks.  If still having pain after 2 weeks, complete 3rd-week of NSAID. May use remaining NSAID as needed once daily for pain control.  Do not to use additional over-the-counter NSAIDs (ibuprofen, naproxen, Advil, Aleve) while taking prescription NSAIDs.  May use Tylenol [phone] mg 2 to 3 times a day for breakthrough pain. Pt referral 4 week follow up

## 2023-11-17 NOTE — Progress Notes (Signed)
[name] [address] [place] [address] Phone: [phone]   Assessment and Plan:    1. Chronic bilateral low back pain without sciatica 2. Somatic dysfunction of thoracic region 3. Somatic dysfunction of lumbar region 4. Somatic dysfunction of pelvic region  -Chronic with exacerbation, subsequent visit - Overall improvement in chronic muscular dysfunction and low back with HEP, OMT, course of meloxicam.  Reassuring that patient had no unremarkable findings on x-ray per radiology report.  Still most consistent with lumbar dysfunction likely caused by physical activity at work - Complete course of meloxicam and then discontinue medication - After completing meloxicam course, start Tylenol as needed for day-to-day pain relief - Continue HEP and start physical therapy.  PT number provided to patient so that he could contact our office and establish care.  He thinks he may have missed their phone call - Patient has received relief with OMT in the past.  Elects for repeat OMT today.  Tolerated well per note below. - Decision today to treat with OMT was based on Physical Exam  After verbal consent patient was treated with HVLA (high velocity low amplitude), ME (muscle energy), FPR (flex positional release), ST (soft tissue), PC/PD (Pelvic Compression/ Pelvic Decompression) techniques in thoracic, lumbar, and pelvic areas. Patient tolerated the procedure well with improvement in symptoms.  Patient educated on potential side effects of soreness and recommended to rest, hydrate, and use Tylenol as needed for pain control.   Pertinent previous records reviewed include none  Follow Up: 4 to 6 weeks for reevaluation.  Could consider repeat OMT.  If no improvement, could consider advanced imaging   Subjective:   I, Michael Esparza, am serving as a Neurosurgeon for Doctor Michael Esparza   Chief Complaint: low back pain    HPI:     10/23/2023 Patient is a 28 year old male with low back pain. 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 Pain radiates up the back when he lifts heavy. PRN meds. Low back numbness that is more frequent. He isnt able to lay flat on the floor. He had a pop that relieved his pain a couple of weeks ago    11/20/2023 Patient states that he feels like he is doing better still has intermittent pain but less frequent    Relevant Historical Information: None pertinent   Additional pertinent review of systems negative.   Current Outpatient Medications:    amphetamine-dextroamphetamine (ADDERALL XR) 20 MG 24 hr capsule, Take 1 capsule (20 mg total) by mouth daily., Disp: 30 capsule, Rfl: 0   cetirizine (ZYRTEC) 10 MG tablet, Take 1 tablet (10 mg total) by mouth daily., Disp: 30 tablet, Rfl: 6   fluticasone (FLONASE) 50 MCG/ACT nasal spray, Place 1 spray into both nostrils daily., Disp: 16 g, Rfl: 6   meloxicam (MOBIC) 15 MG tablet, Take 1 tablet (15 mg total) by mouth daily., Disp: 30 tablet, Rfl: 0   WIXELA INHUB 250-50 MCG/ACT AEPB, Inhale 1 puff into the lungs in the morning and at bedtime., Disp: , Rfl:    Objective:     Vitals:   11/20/23 0748  BP: 110/82  Pulse: 67  SpO2: 99%  Weight: 203 lb (92.1 kg)  Height: 5\' 10"  (1.778 m)      Body mass index is 29.13 kg/m.    Physical Exam:     General: Well-appearing, cooperative, sitting comfortably in no  acute distress.   OMT Physical Exam:  ASIS Compression Test: Positive Right   Thoracic: TTP paraspinal, T4-6 RRSL Lumbar: TTP paraspinal, L1-3 RRSL Pelvis: Right anterior innominate    Electronically signed by:  [name] 8:33 AM 11/20/23

## 2023-11-20 ENCOUNTER — Ambulatory Visit (INDEPENDENT_AMBULATORY_CARE_PROVIDER_SITE_OTHER): Payer: BC Managed Care – PPO | Admitting: Sports Medicine

## 2023-11-20 VITALS — BP 110/82 | HR 67 | Ht 70.0 in | Wt 203.0 lb

## 2023-11-20 DIAGNOSIS — M9905 Segmental and somatic dysfunction of pelvic region: Secondary | ICD-10-CM

## 2023-11-20 DIAGNOSIS — M9902 Segmental and somatic dysfunction of thoracic region: Secondary | ICD-10-CM

## 2023-11-20 DIAGNOSIS — G8929 Other chronic pain: Secondary | ICD-10-CM

## 2023-11-20 DIAGNOSIS — M545 Low back pain, unspecified: Secondary | ICD-10-CM | POA: Diagnosis not present

## 2023-11-20 DIAGNOSIS — M9903 Segmental and somatic dysfunction of lumbar region: Secondary | ICD-10-CM | POA: Diagnosis not present

## 2023-11-20 NOTE — Patient Instructions (Addendum)
Complete meloxicam and then discontinue  Use tylenol for day to day pain PT [phone] 4-6 week follow up

## 2023-11-22 ENCOUNTER — Other Ambulatory Visit: Payer: Self-pay | Admitting: Sports Medicine

## 2023-12-19 NOTE — Progress Notes (Deleted)
   [name] [address] [place] [address] Phone: [phone]   Assessment and Plan:     There are no diagnoses linked to this encounter.  *** - Patient has received relief with OMT in the past.  Elects for repeat OMT today.  Tolerated well per note below. - Decision today to treat with OMT was based on Physical Exam   After verbal consent patient was treated with HVLA (high velocity low amplitude), ME (muscle energy), FPR (flex positional release), ST (soft tissue), PC/PD (Pelvic Compression/ Pelvic Decompression) techniques in cervical, rib, thoracic, lumbar, and pelvic areas. Patient tolerated the procedure well with improvement in symptoms.  Patient educated on potential side effects of soreness and recommended to rest, hydrate, and use Tylenol as needed for pain control.   Pertinent previous records reviewed include ***    Follow Up: ***     Subjective:   I, Michael Esparza, am serving as a Neurosurgeon for Doctor Richardean Sale   Chief Complaint: low back pain    HPI:    10/23/2023 Patient is a 28 year old male with low back pain. 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 Pain radiates up the back when he lifts heavy. PRN meds. Low back numbness that is more frequent. He isnt able to lay flat on the floor. He had a pop that relieved his pain a couple of weeks ago    11/20/2023 Patient states that he feels like he is doing better still has intermittent pain but less frequent   12/20/2023 Patient states   Relevant Historical Information: None pertinent  Additional pertinent review of systems negative.  Current Outpatient Medications  Medication Sig Dispense Refill   amphetamine-dextroamphetamine (ADDERALL XR) 20 MG 24 hr capsule Take 1 capsule (20 mg total) by mouth daily. 30 capsule 0   cetirizine (ZYRTEC) 10 MG tablet Take 1 tablet (10 mg total) by mouth daily. 30 tablet 6    fluticasone (FLONASE) 50 MCG/ACT nasal spray Place 1 spray into both nostrils daily. 16 g 6   meloxicam (MOBIC) 15 MG tablet Take 1 tablet (15 mg total) by mouth daily. 30 tablet 0   WIXELA INHUB 250-50 MCG/ACT AEPB Inhale 1 puff into the lungs in the morning and at bedtime.     No current facility-administered medications for this visit.      Objective:     There were no vitals filed for this visit.    There is no height or weight on file to calculate BMI.    Physical Exam:     General: Well-appearing, cooperative, sitting comfortably in no acute distress.   OMT Physical Exam:  ASIS Compression Test: Positive Right Cervical: TTP paraspinal, *** Rib: Bilateral elevated first rib with TTP Thoracic: TTP paraspinal,*** Lumbar: TTP paraspinal,*** Pelvis: Right anterior innominate  Electronically signed by:  [name] 7:31 AM 12/19/23

## 2023-12-20 ENCOUNTER — Ambulatory Visit: Payer: BC Managed Care – PPO | Admitting: Sports Medicine

## 2023-12-25 ENCOUNTER — Ambulatory Visit (INDEPENDENT_AMBULATORY_CARE_PROVIDER_SITE_OTHER): Admitting: Physician Assistant

## 2023-12-25 ENCOUNTER — Encounter: Payer: Self-pay | Admitting: Physician Assistant

## 2023-12-25 VITALS — BP 130/84 | HR 93 | Temp 97.0°F | Ht 70.0 in | Wt 207.0 lb

## 2023-12-25 DIAGNOSIS — F902 Attention-deficit hyperactivity disorder, combined type: Secondary | ICD-10-CM

## 2023-12-25 DIAGNOSIS — K219 Gastro-esophageal reflux disease without esophagitis: Secondary | ICD-10-CM | POA: Diagnosis not present

## 2023-12-25 MED ORDER — PANTOPRAZOLE SODIUM 40 MG PO TBEC: 40.0000 mg | DELAYED_RELEASE_TABLET | Freq: Every day | ORAL | 0 refills | Status: DC

## 2023-12-25 MED ORDER — AMPHETAMINE-DEXTROAMPHET ER 20 MG PO CP24: 20.0000 mg | ORAL_CAPSULE | Freq: Every day | ORAL | 0 refills | Status: AC

## 2023-12-25 NOTE — Progress Notes (Signed)
 Patient ID: Michael Esparza, male    DOB: [birth year], 28 y.o.   MRN: [id number]   Assessment & Plan:  Gastroesophageal reflux disease, unspecified whether esophagitis present  Attention deficit hyperactivity disorder (ADHD), combined type  Other orders -     Pantoprazole Sodium; Take 1 tablet (40 mg total) by mouth daily before breakfast.  Dispense: 30 tablet; Refill: 0 -     Amphetamine-Dextroamphet ER; Take 1 capsule (20 mg total) by mouth daily.  Dispense: 30 capsule; Refill: 0      Assessment and Plan Assessment & Plan Gastroesophageal reflux disease (GERD) Worsening symptoms over the last two and a half weeks, including morning gagging and postprandial vomiting, without associated abdominal pain. Symptoms are not related to stress, caffeine, or smoking. Family history of peptic ulcers. Previous Prilosec treatment was ineffective, possibly due to improper administration. Differential includes GERD, peptic ulcer disease, or environmental allergies. Plan to address potential acid overproduction and consider environmental allergies. - Prescribe Protonix 40 mg once daily before breakfast, to be taken with water and wait 30 minutes before eating or drinking. - Recommend trial of Flonase or Zyrtec for potential environmental allergies. - Advise dietary modifications to avoid spicy, greasy, and tomato-based foods. - If symptoms persist or worsen, consider referral to a gastroenterologist for further evaluation, including possible endoscopy. - Follow-up in two weeks to reassess symptoms and consider further diagnostic testing if necessary.  Attention Deficit Hyperactivity Disorder (ADHD) Currently taking Adderall and reports difficulty focusing without it. Awaiting evaluation appointment in April. Concern that Adderall may contribute to gastrointestinal symptoms, to be discussed with the specialist. - Provide a 30-day supply of Adderall to cover until the specialist appointment in April. PDMP  reviewed today, no red flags, filling appropriately. This will be the last prescription I am able to fill for him. - Discuss with the specialist the potential impact of Adderall on gastrointestinal symptoms during the upcoming appointment.      Return in about 2 weeks (around 01/08/2024) for recheck/follow-up.    Subjective:    Chief Complaint  Patient presents with   ADHD    Pt in office for medication refills and wants to also discuss stomach issues; Pt has appt at [facility] in 3 weeks; pt states past couple months been throwing up every morning; pt admits to always had issues with acid reflux; pt states Dad has stomach ulcers and he does as well. Unable to eat until mid day due to vomiting.     HPI Discussed the use of AI scribe software for clinical note transcription with the patient, who gave verbal consent to proceed.  History of Present Illness Michael Esparza is a 28 year old male who presents with worsening stomach issues and vomiting.   He has been experiencing significant stomach issues over the past two and a half weeks, characterized by frequent vomiting. Vomiting occurs every morning and at dinnertime, with episodes of gagging and inability to keep food down. Despite these symptoms, there is no stomach pain or nausea. The vomiting occurs Esparza when he does not take his Adderall, suggesting the medication may not be the sole cause of his symptoms. No recent changes in stress levels, caffeine intake, or dietary habits. No recent travel, changes in diet, or exposure to new foods. No significant weight loss, difficulty swallowing, or allergy symptoms this year, although he typically struggles with allergies.  He has tried taking Prilosec, as advised by his mother, but it did not provide relief. He took  it with food as instructed. He primarily drinks bottled water and occasionally sweet tea. He does not smoke or consume alcohol.  His father had stomach ulcers at  a similar age, which were treated successfully with medication. 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     Past Medical History:  Diagnosis Date   ADHD (attention deficit hyperactivity disorder)    Anxiety    Asperger's syndrome    Disc degeneration, lumbar    accident during work (tree cutting)   Fracture of left hand 10/03/2012   laceration, hedge clipping   GERD (gastroesophageal reflux disease)    High-functioning autism spectrum disorder    Post concussion syndrome 07/21/2014    Past Surgical History:  Procedure Laterality Date   FRACTURE SURGERY     OPEN REDUCTION INTERNAL FIXATION (ORIF) HAND Left 12/21/2015   Procedure: OPEN REDUCTION INTERNAL FIXATION (ORIF) HAND, NERVE AND TENDON REPAIR;  Surgeon: Mack Hook, MD;  Location: [facility] OR;  Service: Orthopedics;  Laterality: Left;    Family History  Problem Relation Age of Onset   Hypertension Mother    Arthritis Father 9       anklosing spondylosis   Ankylosing spondylitis Father    ADD / ADHD Brother    Hyperlipidemia Maternal Grandmother    Parkinson's disease Maternal Grandmother    Diabetes Maternal Grandfather    Cancer Paternal Grandfather     Social History   Tobacco Use   Smoking status: Never   Smokeless tobacco: Never  Vaping Use   Vaping status: Never Used  Substance Use Topics   Alcohol use: No   Drug use: No     No Known Allergies  Review of Systems NEGATIVE UNLESS OTHERWISE INDICATED IN HPI      Objective:     BP 130/84 (BP Location: Left Arm, Patient Position: Sitting, Cuff Size: Normal)   Pulse 93   Temp (!) 97 F (36.1 C) (Temporal)   Ht 5\' 10"  (1.778 m)   Wt 207 lb (93.9 kg)   SpO2 96%   BMI 29.70 kg/m   Wt Readings from Last 3 Encounters:  12/25/23 207 lb (93.9 kg)  11/20/23 203 lb (92.1 kg)  10/23/23 206 lb (93.4 kg)    BP Readings from Last 3 Encounters:  12/25/23 130/84  11/20/23 110/82  10/23/23 120/84     Physical Exam Vitals  and nursing note reviewed.  Constitutional:      Appearance: Normal appearance.  Eyes:     Extraocular Movements: Extraocular movements intact.     Conjunctiva/sclera: Conjunctivae normal.     Pupils: Pupils are equal, round, and reactive to light.  Cardiovascular:     Rate and Rhythm: Normal rate and regular rhythm.     Pulses: Normal pulses.     Heart sounds: Normal heart sounds.  Pulmonary:     Effort: Pulmonary effort is normal.     Breath sounds: Normal breath sounds.  Abdominal:     General: Abdomen is flat. Bowel sounds are normal. There is no distension.     Palpations: Abdomen is soft. There is no mass.     Tenderness: There is no abdominal tenderness. There is no guarding or rebound.     Hernia: No hernia is present.  Neurological:     General: No focal deficit present.     Mental Status: He is alert and oriented to person, place, and time.  Psychiatric:        Mood and  Affect: Mood normal.        Behavior: Behavior normal.       Dudley Mages M Yi Falletta, PA-C

## 2023-12-26 ENCOUNTER — Encounter: Payer: Self-pay | Admitting: Sports Medicine

## 2024-01-04 DIAGNOSIS — R0789 Other chest pain: Secondary | ICD-10-CM | POA: Diagnosis not present

## 2024-01-04 DIAGNOSIS — R0981 Nasal congestion: Secondary | ICD-10-CM | POA: Diagnosis not present

## 2024-01-04 DIAGNOSIS — R051 Acute cough: Secondary | ICD-10-CM | POA: Diagnosis not present

## 2024-01-04 DIAGNOSIS — J0100 Acute maxillary sinusitis, unspecified: Secondary | ICD-10-CM | POA: Diagnosis not present

## 2024-01-08 ENCOUNTER — Ambulatory Visit (INDEPENDENT_AMBULATORY_CARE_PROVIDER_SITE_OTHER): Admitting: Physician Assistant

## 2024-01-08 ENCOUNTER — Encounter: Payer: Self-pay | Admitting: Physician Assistant

## 2024-01-08 VITALS — BP 116/76 | HR 99 | Temp 97.5°F | Ht 70.0 in | Wt 205.4 lb

## 2024-01-08 DIAGNOSIS — R058 Other specified cough: Secondary | ICD-10-CM

## 2024-01-08 DIAGNOSIS — K219 Gastro-esophageal reflux disease without esophagitis: Secondary | ICD-10-CM

## 2024-01-08 MED ORDER — GUAIFENESIN-CODEINE 100-10 MG/5ML PO SOLN: 5.0000 mL | Freq: Three times a day (TID) | ORAL | 0 refills | Status: DC | PRN

## 2024-01-08 MED ORDER — PREDNISONE 20 MG PO TABS: 40.0000 mg | ORAL_TABLET | Freq: Every day | ORAL | 0 refills | Status: AC

## 2024-01-08 NOTE — Progress Notes (Signed)
 Patient ID: Cuong Moorman, male    DOB: 04-Apr-1996, 28 y.o.   MRN: [id number]   Assessment & Plan:  Gastroesophageal reflux disease, unspecified whether esophagitis present  Post-viral cough syndrome  Other orders -     predniSONE; Take 2 tablets (40 mg total) by mouth daily with breakfast for 5 days.  Dispense: 10 tablet; Refill: 0 -     guaiFENesin-Codeine; Take 5 mLs by mouth 3 (three) times daily as needed for cough.  Dispense: 120 mL; Refill: 0      Assessment and Plan Assessment & Plan Post-viral cough Persistent cough following a bacterial infection, likely post-viral. Symptoms include increased fatigue and dizziness, exacerbated by sinus drainage. Initial improvement with antibiotics followed by symptom decline. Prednisone considered for lung inflammation with side effects of increased appetite and feeling wired. Codeine-containing cough syrup recommended for cough management with potential drowsiness. - Prescribe prednisone tablets to reduce lung inflammation. Advise taking with breakfast to minimize side effects such as increased appetite and feeling hyper. - Prescribe codeine-containing cough syrup for cough management. Advise taking in the evening or when at home due to potential drowsiness. - Advise rest and fluid intake to aid recovery.   Gastroesophageal reflux disease (GERD) GERD managed with Protonix. Recent vomiting episodes may relate to bacterial infection or GERD. Consider gastroenterologist referral if symptoms persist. - Continue Protonix. - Advise monitoring symptoms and report if medication is ineffective. - Discuss potential gastroenterologist referral if symptoms persist.  Allergy management Inquired about Zyrtec for allergy management during pollen season. Advised daily use, with option of two doses during high pollen season, but not long-term. - Advise taking Zyrtec once daily, with the possibility of two doses during high pollen season, but not  long-term.  Follow-up Advised to follow up if symptoms persist or worsen. MyChart recommended for non-urgent communication regarding symptoms and medication effectiveness. - Instruct to use MyChart for non-urgent communication regarding symptoms and medication effectiveness. - No return appointment set; advise follow-up in a few weeks if needed.      No follow-ups on file.    Subjective:    Chief Complaint  Patient presents with   Gastroesophageal Reflux    Pt in office to follow up with PCP for GERDS and ADHD discussed last visit; pt states he is still vomiting quite a bit; but also fighting a bacterial infection; seen at [facility] since no openings in office; pt finished Z pack and states it was helping; hasn't been able to sleep due to cough.    Gastroesophageal Reflux   Discussed the use of AI scribe software for clinical note transcription with the patient, who gave verbal consent to proceed.  History of Present Illness Treyshawn Muldrew is a 28 year old male who presents with persistent cough and gastrointestinal symptoms following a bacterial infection.  He began feeling unwell after attending a concert two Sundays ago, experiencing severe diarrhea up to ten times a day. He sought care at an urgent care facility and was diagnosed with a bacterial infection that initially presented as a chest infection. He was prescribed Zithromycin, which he completed, and also took Zyrtec, tea, honey, and used a humidifier. Despite initial improvement, he reports increased fatigue, dizziness, and a persistent, severe cough, described as causing him to 'cough his head off all night.'  He describes chest pain as a sensation of needles pushing into his throat, particularly at night. He has experienced episodes of vomiting over the past six days, although this symptom has improved somewhat.  He has a history of similar respiratory issues since childhood, noting that once an illness affects his chest, it tends  to persist until stronger medication is administered.  He is currently using acid reflux medication, which was effective prior to his recent illness. He has been taking Zyrtec twice daily due to pollen, although he acknowledges that this is more than the usual recommended dose. He is unsure if he still has his Wixela inhaler, which was prescribed in August of the previous year. No wheezing is reported, but significant sinus fluid retention is noted. He denies any current wheezing but notes that his cough is persistent and bothersome. He has been experiencing fatigue and dizziness.     Past Medical History:  Diagnosis Date   ADHD (attention deficit hyperactivity disorder)    Anxiety    Asperger's syndrome    Disc degeneration, lumbar    accident during work (tree cutting)   Fracture of left hand 10/03/2012   laceration, hedge clipping   GERD (gastroesophageal reflux disease)    High-functioning autism spectrum disorder    Post concussion syndrome 07/21/2014    Past Surgical History:  Procedure Laterality Date   FRACTURE SURGERY     OPEN REDUCTION INTERNAL FIXATION (ORIF) HAND Left 12/21/2015   Procedure: OPEN REDUCTION INTERNAL FIXATION (ORIF) HAND, NERVE AND TENDON REPAIR;  Surgeon: Mack Hook, MD;  Location: [facility] OR;  Service: Orthopedics;  Laterality: Left;    Family History  Problem Relation Age of Onset   Hypertension Mother    Arthritis Father 69       anklosing spondylosis   Ankylosing spondylitis Father    ADD / ADHD Brother    Hyperlipidemia Maternal Grandmother    Parkinson's disease Maternal Grandmother    Diabetes Maternal Grandfather    Cancer Paternal Grandfather     Social History   Tobacco Use   Smoking status: Never   Smokeless tobacco: Never  Vaping Use   Vaping status: Never Used  Substance Use Topics   Alcohol use: No   Drug use: No     No Known Allergies  Review of Systems NEGATIVE UNLESS OTHERWISE INDICATED IN HPI      Objective:      BP 116/76 (BP Location: Left Arm, Patient Position: Sitting, Cuff Size: Normal)   Pulse 99   Temp (!) 97.5 F (36.4 C) (Temporal)   Ht 5\' 10"  (1.778 m)   Wt 205 lb 6.4 oz (93.2 kg)   SpO2 96%   BMI 29.47 kg/m   Wt Readings from Last 3 Encounters:  01/08/24 205 lb 6.4 oz (93.2 kg)  12/25/23 207 lb (93.9 kg)  11/20/23 203 lb (92.1 kg)    BP Readings from Last 3 Encounters:  01/08/24 116/76  12/25/23 130/84  11/20/23 110/82     Physical Exam Vitals and nursing note reviewed.  Constitutional:      General: He is not in acute distress.    Appearance: Normal appearance. He is not ill-appearing.  HENT:     Head: Normocephalic.     Right Ear: Ear canal and external ear normal. A middle ear effusion is present.     Left Ear: Ear canal and external ear normal. A middle ear effusion is present.     Nose: Congestion present.     Mouth/Throat:     Mouth: Mucous membranes are moist.     Pharynx: No oropharyngeal exudate or posterior oropharyngeal erythema.  Eyes:     Extraocular Movements: Extraocular movements intact.  Conjunctiva/sclera: Conjunctivae normal.     Pupils: Pupils are equal, round, and reactive to light.  Cardiovascular:     Rate and Rhythm: Normal rate and regular rhythm.     Pulses: Normal pulses.     Heart sounds: Normal heart sounds. No murmur heard. Pulmonary:     Effort: Pulmonary effort is normal. No respiratory distress.     Breath sounds: Normal breath sounds. No wheezing.     Comments: Frequent dry cough Musculoskeletal:     Cervical back: Normal range of motion.  Skin:    General: Skin is warm.  Neurological:     Mental Status: He is alert and oriented to person, place, and time.  Psychiatric:        Mood and Affect: Mood normal.        Behavior: Behavior normal.             Vedha Tercero M Tarig Zimmers, PA-C

## 2024-01-14 ENCOUNTER — Encounter: Payer: Self-pay | Admitting: Physician Assistant

## 2024-01-15 ENCOUNTER — Other Ambulatory Visit: Payer: Self-pay

## 2024-01-15 DIAGNOSIS — K219 Gastro-esophageal reflux disease without esophagitis: Secondary | ICD-10-CM

## 2024-01-16 ENCOUNTER — Other Ambulatory Visit: Payer: Self-pay | Admitting: Physician Assistant

## 2024-01-30 DIAGNOSIS — R41840 Attention and concentration deficit: Secondary | ICD-10-CM | POA: Diagnosis not present

## 2024-01-31 ENCOUNTER — Encounter: Payer: Self-pay | Admitting: Physician Assistant

## 2024-02-01 ENCOUNTER — Other Ambulatory Visit: Payer: Self-pay | Admitting: Physician Assistant

## 2024-02-01 ENCOUNTER — Encounter: Payer: Self-pay | Admitting: Physician Assistant

## 2024-02-01 NOTE — Telephone Encounter (Signed)
 Please see pt msg and advise

## 2024-02-08 DIAGNOSIS — Z79899 Other long term (current) drug therapy: Secondary | ICD-10-CM | POA: Diagnosis not present

## 2024-02-08 DIAGNOSIS — F840 Autistic disorder: Secondary | ICD-10-CM | POA: Diagnosis not present

## 2024-02-08 DIAGNOSIS — F902 Attention-deficit hyperactivity disorder, combined type: Secondary | ICD-10-CM | POA: Diagnosis not present

## 2024-03-03 NOTE — Progress Notes (Unsigned)
 Brigitte Canard, PA-C [address] [place], [place]  [address] Phone: [phone]   Gastroenterology Consultation  Referring Provider:     Allwardt, Deleta Felix, PA-C Primary Care Physician:  Allwardt, Deleta Felix, PA-C Primary Gastroenterologist:  Brigitte Canard, PA-C / Darol Elizabeth, MD  Reason for Consultation:     GERD, Nausea, Vomiting        HPI:   Michael Esparza is a 28 y.o. y/o male referred for consultation & management  by Allwardt, Deleta Felix, PA-C.    New patient.  Referred to evaluate severe GERD.  Patient states he has a history of acid reflux since he was a teenager, for approximately 10 years.  He states his acid reflux has been worse in the past 5 years and has been severe in the past 1 year.  He has been taking pantoprazole  40 Mg once daily every morning which is not controlling his reflux.  He reports daily nausea and vomiting since August 2024.  He vomits acid.  He is having severe heartburn.  He denies alcohol, marijuana, tobacco, or drug use ever.  The nausea and vomiting are affecting his work.  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  He is not able to eat.  His weight is down 4 pounds in the past 2 months.  Overall, weight has been fairly stable.  He has also been having some generalized abdominal cramping, worse in the lower abdomen - RLQ, LLQ.  He has had urinary frequency for the past 4 days.  He denies constipation, or rectal bleeding.  He has not had any recent lab work or abdominal imaging.  No previous EGD or GI evaluation.  PMH: Asperger's syndrome, ADHD, chronic low back pain, GERD.  Past Medical History:  Diagnosis Date   ADHD (attention deficit hyperactivity disorder)    Anxiety    Asperger's syndrome    Disc degeneration, lumbar    accident during work (tree cutting)   Fracture of left hand 10/03/2012   laceration, hedge clipping   GERD (gastroesophageal reflux disease)    High-functioning autism spectrum  disorder    Post concussion syndrome 07/21/2014    Past Surgical History:  Procedure Laterality Date   FRACTURE SURGERY     OPEN REDUCTION INTERNAL FIXATION (ORIF) HAND Left 12/21/2015   Procedure: OPEN REDUCTION INTERNAL FIXATION (ORIF) HAND, NERVE AND TENDON REPAIR;  Surgeon: Rober Chimera, MD;  Location: [facility] OR;  Service: Orthopedics;  Laterality: Left;    Prior to Admission medications   Medication Sig Start Date End Date Taking? Authorizing Provider  albuterol (VENTOLIN HFA) 108 (90 Base) MCG/ACT inhaler Inhale 2 puffs into the lungs every 6 (six) hours as needed for shortness of breath or wheezing. 01/04/24 01/03/25  [provider]  amphetamine -dextroamphetamine (ADDERALL  XR) 20 MG 24 hr capsule Take 1 capsule (20 mg total) by mouth daily. 12/25/23 01/24/24  Allwardt, Alyssa M, PA-C  guaiFENesin -codeine  100-10 MG/5ML syrup Take 5 mLs by mouth 3 (three) times daily as needed for cough. 01/08/24   Allwardt, Alyssa M, PA-C  levocetirizine (XYZAL) 5 MG tablet Take 5 mg by mouth every evening. 01/04/24 01/03/25  [provider]  pantoprazole  (PROTONIX ) 40 MG tablet TAKE 1 TABLET BY MOUTH DAILY BEFORE BREAKFAST 01/16/24   Allwardt, Alyssa M, PA-C  WIXELA INHUB 250-50 MCG/ACT AEPB Inhale 1 puff into the lungs in the morning and at bedtime. 05/09/23   [provider]    Family  History  Problem Relation Age of Onset   Hypertension Mother    Arthritis Father 32       anklosing spondylosis   Ankylosing spondylitis Father    Bipolar disorder Sister    ADD / ADHD Brother    Hyperlipidemia Maternal Grandmother    Parkinson's disease Maternal Grandmother    Diabetes Maternal Grandfather    Cancer Paternal Grandfather      Social History   Tobacco Use   Smoking status: Never   Smokeless tobacco: Never  Vaping Use   Vaping status: Never Used  Substance Use Topics   Alcohol use: No   Drug use: No    Allergies as of 03/04/2024   (No Known Allergies)    Review of  Systems:    All systems reviewed and negative except where noted in HPI.   Physical Exam:  BP 120/76 (BP Location: Left Arm, Patient Position: Sitting, Cuff Size: Normal)   Pulse 90   Ht 5' 9.5" (1.765 m) Comment: Height measured without shoes  Wt 203 lb 1 oz (92.1 kg)   BMI 29.56 kg/m  No LMP for male patient.  General:   Alert,  Well-developed, well-nourished, pleasant and cooperative in NAD Lungs:  Respirations even and unlabored.  Clear throughout to auscultation.   No wheezes, crackles, or rhonchi. No acute distress. Heart:  Regular rate and rhythm; no murmurs, clicks, rubs, or gallops. Abdomen:  Normal bowel sounds.  No bruits.  Soft, and non-distended without masses, hepatosplenomegaly or hernias noted.  Moderate RLQ and LLQ tenderness (6 or 7 out of 10 on pain scale).  No upper abdominal tenderness.  No guarding or rebound tenderness.    Neurologic:  Alert and oriented x3;  grossly normal neurologically. Psych:  Alert and cooperative. Normal mood and affect.  Imaging Studies: No results found.  Labs: No Recent.    Assessment and Plan:   Michael Esparza is a 28 y.o. y/o male has been referred for:  GERD for 10 years; Worsening and Severe x 1 year; NOT controlled on Pantoprazole  40mg  once daily. Nausea and Vomiting for 9 months. Abdominal Pain, Generalized; Bilateral lower abdominal tenderness. Urinary Frequency x 4 days  Differential diagnosis includes, but is not limited to, severe GERD, esophagitis, gastritis, H. pylori, peptic ulcer, cholelithiasis, pancreatitis, UTI.  Plan:  - Labs: CBC, CMP, Lipase - Urinalusis: rule out UTI - RUQ Ultrasound: Rule out gallstones - EGD: Check for H. Pylori, esophagitis, gastritis, PUD. - Increase Pantoprazole  to 40mg  1 tablet twice daily. - Recommend Lifestyle Modifications to prevent Acid Reflux.  Rec. Avoid coffee, sodas, peppermint, garlic, onions, alcohol, citrus fruits, chocolate, tomatoes, fatty and spicey foods.  Avoid eating  2-3 hours before bedtime.    Follow up with TG 2 - 4 weeks after EGD.  Brigitte Canard, PA-C

## 2024-03-04 ENCOUNTER — Other Ambulatory Visit (INDEPENDENT_AMBULATORY_CARE_PROVIDER_SITE_OTHER)

## 2024-03-04 ENCOUNTER — Encounter: Payer: Self-pay | Admitting: Physician Assistant

## 2024-03-04 ENCOUNTER — Ambulatory Visit (INDEPENDENT_AMBULATORY_CARE_PROVIDER_SITE_OTHER): Admitting: Physician Assistant

## 2024-03-04 VITALS — BP 120/76 | HR 90 | Ht 69.5 in | Wt 203.1 lb

## 2024-03-04 DIAGNOSIS — R112 Nausea with vomiting, unspecified: Secondary | ICD-10-CM | POA: Diagnosis not present

## 2024-03-04 DIAGNOSIS — R350 Frequency of micturition: Secondary | ICD-10-CM | POA: Diagnosis not present

## 2024-03-04 DIAGNOSIS — R1084 Generalized abdominal pain: Secondary | ICD-10-CM

## 2024-03-04 DIAGNOSIS — K219 Gastro-esophageal reflux disease without esophagitis: Secondary | ICD-10-CM

## 2024-03-04 LAB — URINALYSIS WITH CULTURE, IF INDICATED
Bilirubin Urine: NEGATIVE
Ketones, ur: NEGATIVE
Leukocytes,Ua: NEGATIVE
Nitrite: NEGATIVE
Specific Gravity, Urine: >=1.03 — AB (ref 1.000–1.030)
Total Protein, Urine: NEGATIVE
Urine Glucose: NEGATIVE
Urobilinogen, UA: 0.2 (ref 0.0–1.0)
WBC, UA: NONE SEEN (ref 0–?)
pH: 6 (ref 5.0–8.0)

## 2024-03-04 LAB — COMPREHENSIVE METABOLIC PANEL WITH GFR
ALT: 33 U/L (ref 0–53)
AST: 18 U/L (ref 0–37)
Albumin: 5.1 g/dL (ref 3.5–5.2)
Alkaline Phosphatase: 53 U/L (ref 39–117)
BUN: 22 mg/dL (ref 6–23)
CO2: 27 meq/L (ref 19–32)
Calcium: 9.9 mg/dL (ref 8.4–10.5)
Chloride: 102 meq/L (ref 96–112)
Creatinine, Ser: 0.85 mg/dL (ref 0.40–1.50)
GFR: 120.13 mL/min (ref >60.00)
Glucose, Bld: 98 mg/dL (ref 70–99)
Potassium: 4.5 meq/L (ref 3.5–5.1)
Sodium: 137 meq/L (ref 135–145)
Total Bilirubin: 0.6 mg/dL (ref 0.2–1.2)
Total Protein: 7.8 g/dL (ref 6.0–8.3)

## 2024-03-04 LAB — CBC WITH DIFFERENTIAL/PLATELET
Basophils Absolute: 0 10*3/uL (ref 0.0–0.1)
Basophils Relative: 0.7 % (ref 0.0–3.0)
Eosinophils Absolute: 0.1 10*3/uL (ref 0.0–0.7)
Eosinophils Relative: 0.9 % (ref 0.0–5.0)
HCT: 46.2 % (ref 39.0–52.0)
Hemoglobin: 15.8 g/dL (ref 13.0–17.0)
Lymphocytes Relative: 22.6 % (ref 12.0–46.0)
Lymphs Abs: 1.5 10*3/uL (ref 0.7–4.0)
MCHC: 34.2 g/dL (ref 30.0–36.0)
MCV: 85 fl (ref 78.0–100.0)
Monocytes Absolute: 0.6 10*3/uL (ref 0.1–1.0)
Monocytes Relative: 9.7 % (ref 3.0–12.0)
Neutro Abs: 4.3 10*3/uL (ref 1.4–7.7)
Neutrophils Relative %: 66.1 % (ref 43.0–77.0)
Platelets: 354 10*3/uL (ref 150.0–400.0)
RBC: 5.43 Mil/uL (ref 4.22–5.81)
RDW: 12.4 % (ref 11.5–15.5)
WBC: 6.5 10*3/uL (ref 4.0–10.5)

## 2024-03-04 LAB — LIPASE: Lipase: 24 U/L (ref 11.0–59.0)

## 2024-03-04 MED ORDER — PANTOPRAZOLE SODIUM 40 MG PO TBEC: 40.0000 mg | DELAYED_RELEASE_TABLET | Freq: Two times a day (BID) | ORAL | 1 refills | Status: DC

## 2024-03-04 MED ORDER — PANTOPRAZOLE SODIUM 40 MG PO TBEC: 40.0000 mg | DELAYED_RELEASE_TABLET | Freq: Two times a day (BID) | ORAL | 2 refills | Status: DC

## 2024-03-04 NOTE — Patient Instructions (Addendum)
 You have been scheduled for an abdominal ultrasound at [facility] (1st floor of hospital) on 03/06/24 at 11:00 am . Please arrive 30 minutes prior to your appointment for registration. Make certain not to have anything to eat or drink 6 hours prior to your appointment. Should you need to reschedule your appointment, please contact radiology at [phone]. This test typically takes about 30 minutes to perform.  Your provider has requested that you go to the basement level for lab work before leaving today. Press "B" on the elevator. The lab is located at the first door on the left as you exit the elevator.   Increase your Pantoprazole  to 40 mg -1 tablet by mouth twice daily.   Please see Tonna Frederic Handout   You have been scheduled for an endoscopy. Please follow written instructions given to you at your visit today.  If you use inhalers (even only as needed), please bring them with you on the day of your procedure.  If you take any of the following medications, they will need to be adjusted prior to your procedure:   DO NOT TAKE 7 DAYS PRIOR TO TEST- Trulicity (dulaglutide) Ozempic, Wegovy (semaglutide) Mounjaro (tirzepatide) Bydureon Bcise (exanatide extended release)  DO NOT TAKE 1 DAY PRIOR TO YOUR TEST Rybelsus (semaglutide) Adlyxin (lixisenatide) Victoza (liraglutide) Byetta (exanatide) ___________________________________________________________________________  Due to recent changes in healthcare laws, you may see the results of your imaging and laboratory studies on MyChart before your provider has had a chance to review them.  We understand that in some cases there may be results that are confusing or concerning to you. Not all laboratory results come back in the same time frame and the provider may be waiting for multiple results in order to interpret others.  Please give us  48 hours in order for your provider to thoroughly review all the results before contacting the office  for clarification of your results.   Thank you for choosing me and [facility].  Brigitte Canard, PA-C

## 2024-03-05 ENCOUNTER — Ambulatory Visit: Payer: Self-pay | Admitting: Physician Assistant

## 2024-03-06 ENCOUNTER — Ambulatory Visit (HOSPITAL_COMMUNITY)
Admission: RE | Admit: 2024-03-06 | Discharge: 2024-03-06 | Disposition: A | Discharge status: Home / Self Care | Source: Ambulatory Visit | Attending: Physician Assistant | Admitting: Physician Assistant

## 2024-03-06 DIAGNOSIS — R112 Nausea with vomiting, unspecified: Secondary | ICD-10-CM | POA: Diagnosis not present

## 2024-03-06 DIAGNOSIS — K219 Gastro-esophageal reflux disease without esophagitis: Secondary | ICD-10-CM | POA: Insufficient documentation

## 2024-03-06 DIAGNOSIS — R1084 Generalized abdominal pain: Secondary | ICD-10-CM | POA: Diagnosis not present

## 2024-03-11 ENCOUNTER — Ambulatory Visit (INDEPENDENT_AMBULATORY_CARE_PROVIDER_SITE_OTHER): Admitting: Physician Assistant

## 2024-03-11 VITALS — BP 118/78 | HR 78 | Temp 98.3°F | Ht 69.5 in | Wt 203.8 lb

## 2024-03-11 DIAGNOSIS — R112 Nausea with vomiting, unspecified: Secondary | ICD-10-CM

## 2024-03-11 DIAGNOSIS — R350 Frequency of micturition: Secondary | ICD-10-CM | POA: Diagnosis not present

## 2024-03-11 DIAGNOSIS — R3129 Other microscopic hematuria: Secondary | ICD-10-CM

## 2024-03-11 DIAGNOSIS — R197 Diarrhea, unspecified: Secondary | ICD-10-CM | POA: Diagnosis not present

## 2024-03-11 DIAGNOSIS — K219 Gastro-esophageal reflux disease without esophagitis: Secondary | ICD-10-CM

## 2024-03-11 DIAGNOSIS — R1084 Generalized abdominal pain: Secondary | ICD-10-CM

## 2024-03-11 NOTE — Progress Notes (Signed)
 Patient ID: Michael Esparza, male    DOB: 04-Mar-1996, 28 y.o.   MRN: [id number]   Assessment & Plan:  Diarrhea, unspecified type -     C. difficile GDH and Toxin A/B -     GI Profile, Stool, PCR -     Ova and parasite examination  Other microscopic hematuria -     Urinalysis, Routine w reflex microscopic -     Urine Culture  Frequent urination -     Urinalysis, Routine w reflex microscopic -     Urine Culture  Gastroesophageal reflux disease, unspecified whether esophagitis present  Generalized abdominal pain  Nausea and vomiting, unspecified vomiting type      Assessment and Plan Assessment & Plan Gastrointestinal Symptoms Severe gastrointestinal symptoms include vomiting, diarrhea, and right-sided abdominal cramping. Symptoms have persisted for several months, with diarrhea worsening over the last two and a half weeks. Vomiting of medications and food is present, and diarrhea occurs three to four times daily, with stools described as loose, watery, and varying in color. Severe acid reflux has worsened over the last year. An endoscopy is scheduled, but symptoms suggest involvement of both upper and lower gastrointestinal tracts. Differential diagnosis includes C. difficile infection, other bacterial or viral infections, or gastrointestinal disorders such as inflammatory bowel disease. - Order stool sample to test for parasites, bacteria, viruses, and C. difficile. - Message gastroenterologist to inform about lower gastrointestinal symptoms. - Advise to avoid greasy and spicy foods. - Recommend a diet of chicken broth, plain chicken, toast, and yogurt. - Suggest over-the-counter probiotics.  Hematuria Urinary frequency and visible hematuria are reported. Urinalysis showed a moderate amount of blood but no evidence of infection. Differential diagnosis includes kidney stones, infection, or other renal issues. The etiology of hematuria is unclear, and further investigation is  needed. - Order micro urine test and urine culture. - Consider urology referral and/ or imaging  ER precautions discussed      Return in about 4 weeks (around 04/08/2024) for recheck/follow-up.    Subjective:    Chief Complaint  Patient presents with   Medical Management of Chronic Issues    Pt in office today to go over urine results;     HPI Discussed the use of AI scribe software for clinical note transcription with the patient, who gave verbal consent to proceed.  History of Present Illness Michael Esparza is a 28 year old male with a history of severe heartburn and acid reflux who presents with persistent gastrointestinal symptoms including vomiting, diarrhea, and abdominal pain.  He has been experiencing persistent vomiting, particularly in the mornings, and is unable to keep his medications down. Despite taking pantoprazole  twice daily, his symptoms have not improved. He has a history of severe heartburn and acid reflux for the past five years, with significant worsening over the last year.  He describes having constant diarrhea for the past two months, with loose stools occurring three to four times a day. The stools are watery and vary in color from green to brown. No recent travel, consumption of raw foods, or exposure to potential infections. He has not noticed any blood in his stool, although he recalls a past instance of blood which he attributes to a possible cut.  He experiences generalized abdominal cramping, described as a twisting pain primarily on the right side of his abdomen. This pain has been severe enough to interrupt his activities but subsides after a few days.  He reports urinary frequency, urinating at least  ten times a day, including multiple times at night, which disrupts his sleep. He has noticed blood in his urine, which he describes as dark yellow, despite staying hydrated. No history of kidney stones.  His family history includes his father having had  stomach ulcers. He does not use tobacco or drugs and maintains a physically active lifestyle. He reports rarely drinking alcohol, but did have a beer recently to celebrate. No significant weight loss.     Past Medical History:  Diagnosis Date   ADHD (attention deficit hyperactivity disorder)    Anxiety    Asperger's syndrome    Disc degeneration, lumbar    accident during work (tree cutting)   Fracture of left hand 10/03/2012   laceration, hedge clipping   GERD (gastroesophageal reflux disease)    High-functioning autism spectrum disorder    Post concussion syndrome 07/21/2014    Past Surgical History:  Procedure Laterality Date   FRACTURE SURGERY     OPEN REDUCTION INTERNAL FIXATION (ORIF) HAND Left 12/21/2015   Procedure: OPEN REDUCTION INTERNAL FIXATION (ORIF) HAND, NERVE AND TENDON REPAIR;  Surgeon: Rober Chimera, MD;  Location: [facility] OR;  Service: Orthopedics;  Laterality: Left;    Family History  Problem Relation Age of Onset   Hypertension Mother    Arthritis Father 61       anklosing spondylosis   Ankylosing spondylitis Father    Bipolar disorder Sister    ADD / ADHD Brother    Hyperlipidemia Maternal Grandmother    Parkinson's disease Maternal Grandmother    Diabetes Maternal Grandfather    Cancer Paternal Grandfather     Social History   Tobacco Use   Smoking status: Never   Smokeless tobacco: Never  Vaping Use   Vaping status: Never Used  Substance Use Topics   Alcohol use: No   Drug use: No     No Known Allergies  Review of Systems NEGATIVE UNLESS OTHERWISE INDICATED IN HPI      Objective:     BP 118/78 (BP Location: Right Arm, Patient Position: Sitting, Cuff Size: Normal)   Pulse 78   Temp 98.3 F (36.8 C) (Temporal)   Ht 5' 9.5" (1.765 m)   Wt 203 lb 12.8 oz (92.4 kg)   SpO2 97%   BMI 29.66 kg/m   Wt Readings from Last 3 Encounters:  03/11/24 203 lb 12.8 oz (92.4 kg)  03/04/24 203 lb 1 oz (92.1 kg)  01/08/24 205 lb 6.4 oz (93.2  kg)    BP Readings from Last 3 Encounters:  03/11/24 118/78  03/04/24 120/76  01/08/24 116/76     Physical Exam Vitals and nursing note reviewed.  Constitutional:      General: He is not in acute distress.    Appearance: Normal appearance. He is not toxic-appearing.  HENT:     Head: Normocephalic and atraumatic.     Right Ear: External ear normal.     Left Ear: External ear normal.  Eyes:     Extraocular Movements: Extraocular movements intact.     Conjunctiva/sclera: Conjunctivae normal.     Pupils: Pupils are equal, round, and reactive to light.  Cardiovascular:     Rate and Rhythm: Normal rate and regular rhythm.     Pulses: Normal pulses.     Heart sounds: Normal heart sounds.  Pulmonary:     Effort: Pulmonary effort is normal.     Breath sounds: Normal breath sounds.  Abdominal:     General: Abdomen is flat. Bowel sounds  are normal. There is no distension.     Palpations: Abdomen is soft. There is no mass.     Tenderness: There is no abdominal tenderness. There is no right CVA tenderness, left CVA tenderness or guarding.  Musculoskeletal:        General: Normal range of motion.     Cervical back: Normal range of motion and neck supple.  Skin:    General: Skin is warm and dry.  Neurological:     General: No focal deficit present.     Mental Status: He is alert and oriented to person, place, and time.  Psychiatric:        Mood and Affect: Mood normal.        Behavior: Behavior normal.             Orval Dortch M Antoinett Dorman, PA-C

## 2024-03-11 NOTE — Patient Instructions (Signed)
  VISIT SUMMARY: During your visit, we discussed your ongoing gastrointestinal symptoms, including vomiting, diarrhea, and abdominal pain, as well as your urinary frequency and blood in your urine. We have planned further tests to identify the cause of your symptoms and provided dietary recommendations to help manage your condition.  YOUR PLAN: GASTROINTESTINAL SYMPTOMS: You have been experiencing severe gastrointestinal symptoms, including vomiting, diarrhea, and abdominal cramping, which have persisted for several months. -We will order a stool sample to test for parasites, bacteria, viruses, and C. difficile. -I will inform your gastroenterologist about your lower gastrointestinal symptoms. You may need a colonoscopy done with your EGD test as well. -Please avoid greasy and spicy foods. -I recommend a diet of chicken broth, plain chicken, toast, and yogurt. -Consider taking over-the-counter probiotics.  HEMATURIA: You have reported frequent urination and visible blood in your urine. A urinalysis showed a moderate amount of blood but no infection. -We will order a micro urine test and a urine culture to investigate further.  For any sudden worse symptoms, go to the ER.                      Contains text generated by Abridge.                                 Contains text generated by Abridge.

## 2024-03-12 ENCOUNTER — Ambulatory Visit: Payer: Self-pay | Admitting: Physician Assistant

## 2024-03-12 ENCOUNTER — Other Ambulatory Visit

## 2024-03-12 DIAGNOSIS — R197 Diarrhea, unspecified: Secondary | ICD-10-CM | POA: Diagnosis not present

## 2024-03-12 DIAGNOSIS — A0811 Acute gastroenteropathy due to Norwalk agent: Secondary | ICD-10-CM | POA: Diagnosis not present

## 2024-03-12 LAB — URINALYSIS, ROUTINE W REFLEX MICROSCOPIC
Bilirubin Urine: NEGATIVE
Glucose, UA: NEGATIVE
Hgb urine dipstick: NEGATIVE
Ketones, ur: NEGATIVE
Leukocytes,Ua: NEGATIVE
Nitrite: NEGATIVE
Protein, ur: NEGATIVE
Specific Gravity, Urine: 1.019 (ref 1.001–1.035)
pH: 5.5 (ref 5.0–8.0)

## 2024-03-12 LAB — URINE CULTURE
MICRO NUMBER:: 16555892
Result:: NO GROWTH
SPECIMEN QUALITY:: ADEQUATE

## 2024-03-13 LAB — GI PROFILE, STOOL, PCR
Adenovirus F 40/41: NOT DETECTED
Astrovirus: NOT DETECTED
C difficile toxin A/B: NOT DETECTED
Campylobacter: NOT DETECTED
Cryptosporidium: NOT DETECTED
Cyclospora cayetanensis: NOT DETECTED
Entamoeba histolytica: NOT DETECTED
Enteroaggregative E coli: NOT DETECTED
Enteropathogenic E coli: DETECTED — AB
Enterotoxigenic E coli: NOT DETECTED
Giardia lamblia: NOT DETECTED
Norovirus GI/GII: DETECTED — AB
Plesiomonas shigelloides: NOT DETECTED
Rotavirus A: NOT DETECTED
Salmonella: NOT DETECTED
Sapovirus: NOT DETECTED
Shiga-toxin-producing E coli: NOT DETECTED
Shigella/Enteroinvasive E coli: NOT DETECTED
Vibrio cholerae: NOT DETECTED
Vibrio: NOT DETECTED
Yersinia enterocolitica: NOT DETECTED

## 2024-03-14 LAB — C. DIFFICILE GDH AND TOXIN A/B
GDH ANTIGEN: NOT DETECTED
MICRO NUMBER:: 16561604
SPECIMEN QUALITY:: ADEQUATE
TOXIN A AND B: NOT DETECTED

## 2024-03-14 LAB — OVA AND PARASITE EXAMINATION
CONCENTRATE RESULT:: NONE SEEN
MICRO NUMBER:: 16561628
SPECIMEN QUALITY:: ADEQUATE
TRICHROME RESULT:: NONE SEEN

## 2024-03-18 ENCOUNTER — Other Ambulatory Visit: Payer: Self-pay | Admitting: Physician Assistant

## 2024-03-18 MED ORDER — DICYCLOMINE HCL 10 MG PO CAPS: 10.0000 mg | ORAL_CAPSULE | Freq: Three times a day (TID) | ORAL | 0 refills | Status: AC

## 2024-03-18 MED ORDER — AZITHROMYCIN 500 MG PO TABS: ORAL_TABLET | ORAL | 0 refills | Status: DC

## 2024-03-21 ENCOUNTER — Encounter: Payer: Self-pay | Admitting: Gastroenterology

## 2024-03-24 NOTE — Progress Notes (Signed)
 Agree with the assessment and plan as outlined by Brigitte Canard, PA-C.

## 2024-03-29 ENCOUNTER — Ambulatory Visit (AMBULATORY_SURGERY_CENTER): Admitting: Gastroenterology

## 2024-03-29 ENCOUNTER — Encounter: Payer: Self-pay | Admitting: Gastroenterology

## 2024-03-29 VITALS — BP 127/71 | HR 71 | Temp 97.9°F | Resp 17 | Ht 69.5 in | Wt 203.0 lb

## 2024-03-29 DIAGNOSIS — R112 Nausea with vomiting, unspecified: Secondary | ICD-10-CM

## 2024-03-29 DIAGNOSIS — K219 Gastro-esophageal reflux disease without esophagitis: Secondary | ICD-10-CM

## 2024-03-29 DIAGNOSIS — K2289 Other specified disease of esophagus: Secondary | ICD-10-CM

## 2024-03-29 DIAGNOSIS — K2980 Duodenitis without bleeding: Secondary | ICD-10-CM | POA: Diagnosis not present

## 2024-03-29 DIAGNOSIS — K449 Diaphragmatic hernia without obstruction or gangrene: Secondary | ICD-10-CM | POA: Diagnosis not present

## 2024-03-29 DIAGNOSIS — K209 Esophagitis, unspecified without bleeding: Secondary | ICD-10-CM

## 2024-03-29 DIAGNOSIS — K208 Other esophagitis without bleeding: Secondary | ICD-10-CM | POA: Diagnosis not present

## 2024-03-29 DIAGNOSIS — R1084 Generalized abdominal pain: Secondary | ICD-10-CM

## 2024-03-29 MED ORDER — RABEPRAZOLE SODIUM 20 MG PO TBEC: 40.0000 mg | DELAYED_RELEASE_TABLET | Freq: Two times a day (BID) | ORAL | 0 refills | Status: DC

## 2024-03-29 MED ORDER — SODIUM CHLORIDE 0.9 % IV SOLN: 500.0000 mL | Freq: Once | INTRAVENOUS | Status: DC

## 2024-03-29 NOTE — Progress Notes (Signed)
 Updated medical record.

## 2024-03-29 NOTE — Progress Notes (Signed)
 Pt sedate, gd SR's, VSS, report to RN

## 2024-03-29 NOTE — Progress Notes (Signed)
 Called to room to assist during endoscopic procedure.  Patient ID and intended procedure confirmed with present staff. Received instructions for my participation in the procedure from the performing physician.

## 2024-03-29 NOTE — Patient Instructions (Addendum)
 Resume previous diet Continue present medications- new mediction ,Aciphex sent to your pharmacy by JINNY Baldomero PEAK Await pathology results on biopsies done   Handouts/information given for reflux disease and hiatal hernia   YOU HAD AN ENDOSCOPIC PROCEDURE TODAY AT THE [facility]:   Refer to the procedure report that was given to you for any specific questions about what was found during the examination.  If the procedure report does not answer your questions, please call your gastroenterologist to clarify.  If you requested that your care partner not be given the details of your procedure findings, then the procedure report has been included in a sealed envelope for you to review at your convenience later.  YOU SHOULD EXPECT: Some feelings of bloating in the abdomen. Passage of more gas than usual.  Walking can help get rid of the air that was put into your GI tract during the procedure and reduce the bloating. If you had a lower endoscopy (such as a colonoscopy or flexible sigmoidoscopy) you may notice spotting of blood in your stool or on the toilet paper. If you underwent a bowel prep for your procedure, you may not have a normal bowel movement for a few days.  Please Note:  You might notice some irritation and congestion in your nose or some drainage.  This is from the oxygen used during your procedure.  There is no need for concern and it should clear up in a day or so.  SYMPTOMS TO REPORT IMMEDIATELY:  Following upper endoscopy (EGD)  Vomiting of blood or coffee ground material  New chest pain or pain under the shoulder blades  Painful or persistently difficult swallowing  New shortness of breath  Fever of 100F or higher  Black, tarry-looking stools For urgent or emergent issues, a gastroenterologist can be reached at any hour by calling [phone]. Do not use MyChart messaging for urgent concerns.   DIET:  We do recommend a small meal at first, but then you may proceed to  your regular diet.  Drink plenty of fluids but you should avoid alcoholic beverages for 24 hours.  ACTIVITY:  You should plan to take it easy for the rest of today and you should NOT DRIVE or use heavy machinery until tomorrow (because of the sedation medicines used during the test).    FOLLOW UP: Our staff will call the number listed on your records the next business day following your procedure.  We will call around 7:15- 8:00 am to check on you and address any questions or concerns that you may have regarding the information given to you following your procedure. If we do not reach you, we will leave a message.     If any biopsies were taken you will be contacted by phone or by letter within the next 1-3 weeks.  Please call us  at [phone] if you have not heard about the biopsies in 3 weeks.    SIGNATURES/CONFIDENTIALITY: You and/or your care partner have signed paperwork which will be entered into your electronic medical record.  These signatures attest to the fact that that the information above on your After Visit Summary has been reviewed and is understood.  Full responsibility of the confidentiality of this discharge information lies with you and/or your care-partner.

## 2024-03-29 NOTE — Progress Notes (Signed)
 History and Physical Interval Note:  03/29/2024 12:31 PM  Michael Esparza  has presented today for endoscopic procedure(s), with the diagnosis of  Encounter Diagnoses  Name Primary?   Gastroesophageal reflux disease, unspecified whether esophagitis present Yes   Nausea and vomiting, unspecified vomiting type    Abdominal pain, generalized   .  The various methods of evaluation and treatment have been discussed with the patient and/or family. After consideration of risks, benefits and other options for treatment, the patient has consented to  the endoscopic procedure(s).   The patient's history has been reviewed, patient examined, no change in status, stable for endoscopic procedure(s).  I have reviewed the patient's chart and labs.  Questions were answered to the patient's satisfaction.     Joyann Spidle E. Stacia, MD [facility]

## 2024-03-29 NOTE — Op Note (Signed)
 [facility] Patient Name: Michael Esparza Procedure Date: 03/29/2024 12:18 PM MRN: [id number] Endoscopist: Glendia E. Stacia , MD, [id number] Age: 28 Referring MD:  Date of Birth: 1996-09-25 Gender: Male Account #: [id number] Procedure:                Upper GI endoscopy Indications:              Esophageal reflux symptoms that persist despite                            appropriate therapy Medicines:                Monitored Anesthesia Care Procedure:                Pre-Anesthesia Assessment:                           - Prior to the procedure, a History and Physical                            was performed, and patient medications and                            allergies were reviewed. The patient's tolerance of                            previous anesthesia was also reviewed. The risks                            and benefits of the procedure and the sedation                            options and risks were discussed with the patient.                            All questions were answered, and informed consent                            was obtained. Prior Anticoagulants: The patient has                            taken no anticoagulant or antiplatelet agents. ASA                            Grade Assessment: I - A normal, healthy patient.                            After reviewing the risks and benefits, the patient                            was deemed in satisfactory condition to undergo the                            procedure.  After obtaining informed consent, the endoscope was                            passed under direct vision. Throughout the                            procedure, the patient's blood pressure, pulse, and                            oxygen saturations were monitored continuously. The                            GIF [id number] #[id number] was introduced through the                            mouth, and advanced to the second part of  duodenum.                            The upper GI endoscopy was accomplished without                            difficulty. The patient tolerated the procedure                            well. Scope In: Scope Out: Findings:                 The examined portions of the nasopharynx,                            oropharynx and larynx were normal.                           One tongue of salmon-colored mucosa was present at                            37 cm. No other visible abnormalities were present.                            The maximum longitudinal extent of these esophageal                            mucosal changes was 1 cm in length. Biopsies were                            taken with a cold forceps for histology. Estimated                            blood loss was minimal.                           The exam of the esophagus was otherwise normal.                           The gastroesophageal flap  valve was visualized                            endoscopically and classified as Hill Grade IV (no                            fold, wide open lumen, hiatal hernia present).                           A 4 cm hiatal hernia was present.                           A single 4 mm sessile polyp was found in the                            prepyloric region of the stomach. Biopsies were                            taken with a cold forceps for histology. Estimated                            blood loss was minimal.                           The exam of the stomach was otherwise normal.                           The examined duodenum was normal. Complications:            No immediate complications. Estimated Blood Loss:     Estimated blood loss was minimal. Impression:               - The examined portions of the nasopharynx,                            oropharynx and larynx were normal.                           - Salmon-colored mucosa suspicious for                            short-segment Barrett's  esophagus. Biopsied.                           - Gastroesophageal flap valve classified as Hill                            Grade IV (no fold, wide open lumen, hiatal hernia                            present).                           - 4 cm hiatal hernia.                           -  A single gastric polyp. Biopsied.                           - Normal examined duodenum. Recommendation:           - Patient has a contact number available for                            emergencies. The signs and symptoms of potential                            delayed complications were discussed with the                            patient. Return to normal activities tomorrow.                            Written discharge instructions were provided to the                            patient.                           - Resume previous diet.                           - Continue present medications.                           - Await pathology results.                           - Recommend trial of Prilosec (omeprazole) 40 mg PO                            BID for 4 weeks. Stop pantoprazole .                           - Consider fundoplication and hiatal hernia repair                            to better manage GERD symptoms. Corin Formisano E. Stacia, MD 03/29/2024 12:51:38 PM This report has been signed electronically.

## 2024-04-01 ENCOUNTER — Telehealth: Payer: Self-pay

## 2024-04-01 NOTE — Telephone Encounter (Signed)
  Follow up Call-     03/29/2024   11:21 AM  Call back number  Post procedure Call Back phone  # [phone]  Permission to leave phone message Yes     Patient questions:  Do you have a fever, pain , or abdominal swelling? No. Pain Score  0 *  Have you tolerated food without any problems? Yes.    Have you been able to return to your normal activities? Yes.    Do you have any questions about your discharge instructions: Diet   No. Medications  No. Follow up visit  No.  Do you have questions or concerns about your Care? No.  Actions: * If pain score is 4 or above: No action needed, pain <4.

## 2024-04-04 LAB — SURGICAL PATHOLOGY

## 2024-04-08 ENCOUNTER — Ambulatory Visit: Payer: Self-pay | Admitting: Gastroenterology

## 2024-04-08 NOTE — Progress Notes (Signed)
 Michael Esparza,  The biopsies of the polypoid lesion at the junction of your stomach and duodenum showed benign inflammatory changes.  No precancerous changes were seen. The biopsies at the end of your esophagus also showed benign inflammatory changes.  No evidence of Barrett's esophagus was seen.  This is good news.  If your symptoms continue to be poorly controlled with medications and dietary modifications, I would recommend you consider hiatal hernia repair and fundoplication to improve your symptoms.  Please let us  know if you would like to meet with a surgeon to discuss this further.

## 2024-04-10 ENCOUNTER — Ambulatory Visit: Admitting: Physician Assistant

## 2024-04-20 ENCOUNTER — Other Ambulatory Visit: Payer: Self-pay | Admitting: Gastroenterology

## 2024-04-22 DIAGNOSIS — Z87891 Personal history of nicotine dependence: Secondary | ICD-10-CM | POA: Diagnosis not present

## 2024-04-22 DIAGNOSIS — R55 Syncope and collapse: Secondary | ICD-10-CM | POA: Diagnosis not present

## 2024-04-22 DIAGNOSIS — T675XXA Heat exhaustion, unspecified, initial encounter: Secondary | ICD-10-CM | POA: Diagnosis not present

## 2024-04-22 DIAGNOSIS — X30XXXA Exposure to excessive natural heat, initial encounter: Secondary | ICD-10-CM | POA: Diagnosis not present

## 2024-04-23 NOTE — Telephone Encounter (Signed)
 Patient called back.  He said Aciphex  is not helping that much.  He has never tried Nexium .  Please advise on dosing and can send script to [facility]

## 2024-04-23 NOTE — Telephone Encounter (Signed)
 Called and left message for patient to let us  know if the Aciphex  is working well. If not Dr. Stacia is recommending he could try another PPI.

## 2024-04-24 MED ORDER — ESOMEPRAZOLE MAGNESIUM 40 MG PO CPDR: 40.0000 mg | DELAYED_RELEASE_CAPSULE | Freq: Two times a day (BID) | ORAL | 0 refills | Status: DC

## 2024-04-24 NOTE — Telephone Encounter (Signed)
 Per Dr. Stacia:  Recommend high dose Nexium  for 4 weeks, then de-escalate if possible. Please prescribe Nexium  40 mg PO BID #180 [id number]  Script sent to pharmacy

## 2024-05-02 ENCOUNTER — Ambulatory Visit: Admitting: Physician Assistant

## 2024-05-10 DIAGNOSIS — F902 Attention-deficit hyperactivity disorder, combined type: Secondary | ICD-10-CM | POA: Diagnosis not present

## 2024-05-10 DIAGNOSIS — F840 Autistic disorder: Secondary | ICD-10-CM | POA: Diagnosis not present

## 2024-05-10 DIAGNOSIS — R41840 Attention and concentration deficit: Secondary | ICD-10-CM | POA: Diagnosis not present

## 2024-05-10 DIAGNOSIS — Z79899 Other long term (current) drug therapy: Secondary | ICD-10-CM | POA: Diagnosis not present

## 2024-06-13 DIAGNOSIS — Z87891 Personal history of nicotine dependence: Secondary | ICD-10-CM | POA: Diagnosis not present

## 2024-06-13 DIAGNOSIS — R112 Nausea with vomiting, unspecified: Secondary | ICD-10-CM | POA: Diagnosis not present

## 2024-06-13 DIAGNOSIS — K529 Noninfective gastroenteritis and colitis, unspecified: Secondary | ICD-10-CM | POA: Diagnosis not present

## 2024-06-13 DIAGNOSIS — R197 Diarrhea, unspecified: Secondary | ICD-10-CM | POA: Diagnosis not present

## 2024-06-13 DIAGNOSIS — R109 Unspecified abdominal pain: Secondary | ICD-10-CM | POA: Diagnosis not present

## 2024-07-13 DIAGNOSIS — J209 Acute bronchitis, unspecified: Secondary | ICD-10-CM | POA: Diagnosis not present

## 2024-07-15 DIAGNOSIS — J209 Acute bronchitis, unspecified: Secondary | ICD-10-CM | POA: Diagnosis not present

## 2024-07-20 ENCOUNTER — Other Ambulatory Visit: Payer: Self-pay | Admitting: Gastroenterology

## 2024-08-14 ENCOUNTER — Other Ambulatory Visit (HOSPITAL_BASED_OUTPATIENT_CLINIC_OR_DEPARTMENT_OTHER): Payer: Self-pay

## 2024-08-14 MED ORDER — AMPHETAMINE-DEXTROAMPHET ER 20 MG PO CP24
40.0000 mg | ORAL_CAPSULE | Freq: Every day | ORAL | 0 refills | Status: DC
  Filled 2024-08-14: qty 60, 30d supply, fill #0

## 2024-08-21 ENCOUNTER — Other Ambulatory Visit: Payer: Self-pay | Admitting: Gastroenterology

## 2024-09-02 ENCOUNTER — Encounter: Payer: Self-pay | Admitting: Gastroenterology

## 2024-09-12 NOTE — Telephone Encounter (Signed)
 Patient is scheduled to see [facility] 09/13/24.

## 2024-09-13 DIAGNOSIS — R1111 Vomiting without nausea: Secondary | ICD-10-CM | POA: Diagnosis not present

## 2024-09-13 DIAGNOSIS — K219 Gastro-esophageal reflux disease without esophagitis: Secondary | ICD-10-CM | POA: Diagnosis not present

## 2024-09-13 DIAGNOSIS — R6881 Early satiety: Secondary | ICD-10-CM | POA: Diagnosis not present

## 2024-09-13 DIAGNOSIS — K449 Diaphragmatic hernia without obstruction or gangrene: Secondary | ICD-10-CM | POA: Diagnosis not present

## 2024-09-16 ENCOUNTER — Other Ambulatory Visit: Payer: Self-pay | Admitting: General Surgery

## 2024-09-16 DIAGNOSIS — K449 Diaphragmatic hernia without obstruction or gangrene: Secondary | ICD-10-CM

## 2024-09-16 DIAGNOSIS — K219 Gastro-esophageal reflux disease without esophagitis: Secondary | ICD-10-CM

## 2024-09-18 ENCOUNTER — Telehealth: Payer: Self-pay | Admitting: *Deleted

## 2024-09-18 DIAGNOSIS — R112 Nausea with vomiting, unspecified: Secondary | ICD-10-CM

## 2024-09-18 DIAGNOSIS — K219 Gastro-esophageal reflux disease without esophagitis: Secondary | ICD-10-CM

## 2024-09-18 NOTE — Telephone Encounter (Signed)
-----   Message from Glendia Holt, MD sent at 09/13/2024  4:33 PM EST ----- Regarding: FW: referral Team,  Can you please get this patient scheduled for an EGD with Bravo probe placement with me and see when the next available esophageal manometry appointment would be?  If no appointments in the next 3 months, please refer to [facility] for EM (indication:  presurgical evaluation for fundoplication)  Thanks ----- Message ----- From: Tanda Locus, MD Sent: 09/13/2024  11:32 AM EST To: Leita Corona; Glendia FORBES Holt, MD Subject: referral                                       I saw this patient.  Thanks for the referral.  He has a lot of foregut symptoms.  The multiple episodes of vomiting per day, throat clearing, coughing are little bit concerning given the small size of his hiatal hernia.  I told him that we need to do get an upper GI as well as esophageal manometry and Bravo pH study.  Since he did not have any overt signs of esophagitis or GERD on biopsy I think we need to get a true idea of his acid exposure in his esophagus.  And given the fact that he has multiple episodes of vomiting per day I think we need manometry as well before any type of surgical intervention because I do not want to trade 1 set of problems for another set after surgery   Can you get manometry and Bravo pH set up for him?  Thanks Locus

## 2024-09-18 NOTE — Telephone Encounter (Signed)
 Unfortunately esophageal manometry availability at [facility] facility will not be until 01/01/25. Therefore, I have placed a referral within [facility] for patient to have esophageal manometry at [facility].  I have left a voicemail for patient to call back so he can be advised of this referral and so we can get him scheduled for EGD with Bravo.

## 2024-09-18 NOTE — Telephone Encounter (Signed)
 I have spoken to patient to advise of esophageal manometry referral to [facility] given that our first avaialbility at [facility] is not until April.   I have also scheduled an endoscopy with Bravo placement with Dr Stacia on 10/22/24. Patient verbalizes understanding of this information.  Dr Stacia-  I have not ordered BRAVO study in a while. Please refresh my memory. Does he need to hold PPI beforehand for this test?  Of note: patient states that he is having daily vomiting episodes and cannot keep anything down. States he is absolutely miserable. Is trying to stick to bland foods. We discussed very small portioned meals at a time, sitting upright for at least 3 hours after eating/drinking etc. Any additional suggestions while he awaits further testing?

## 2024-09-19 DIAGNOSIS — F902 Attention-deficit hyperactivity disorder, combined type: Secondary | ICD-10-CM | POA: Diagnosis not present

## 2024-09-19 MED ORDER — ONDANSETRON HCL 4 MG PO TABS: 4.0000 mg | ORAL_TABLET | ORAL | 1 refills | Status: AC | PRN

## 2024-09-20 DIAGNOSIS — F902 Attention-deficit hyperactivity disorder, combined type: Secondary | ICD-10-CM | POA: Diagnosis not present

## 2024-09-20 DIAGNOSIS — Z79899 Other long term (current) drug therapy: Secondary | ICD-10-CM | POA: Diagnosis not present

## 2024-09-20 DIAGNOSIS — F419 Anxiety disorder, unspecified: Secondary | ICD-10-CM | POA: Diagnosis not present

## 2024-10-14 ENCOUNTER — Other Ambulatory Visit (HOSPITAL_BASED_OUTPATIENT_CLINIC_OR_DEPARTMENT_OTHER): Payer: Self-pay

## 2024-10-14 MED ORDER — AMPHETAMINE-DEXTROAMPHET ER 30 MG PO CP24
60.0000 mg | ORAL_CAPSULE | Freq: Every day | ORAL | 0 refills | Status: AC
  Filled 2024-10-14: qty 60, 30d supply, fill #0

## 2024-10-14 MED ORDER — HYDROXYZINE HCL 25 MG PO TABS
25.0000 mg | ORAL_TABLET | Freq: Two times a day (BID) | ORAL | 0 refills | Status: AC
  Filled 2024-10-14: qty 60, 30d supply, fill #0

## 2024-10-18 ENCOUNTER — Other Ambulatory Visit (HOSPITAL_BASED_OUTPATIENT_CLINIC_OR_DEPARTMENT_OTHER): Payer: Self-pay

## 2024-10-21 ENCOUNTER — Other Ambulatory Visit (HOSPITAL_BASED_OUTPATIENT_CLINIC_OR_DEPARTMENT_OTHER): Payer: Self-pay

## 2024-10-21 MED ORDER — AMPHETAMINE-DEXTROAMPHET ER 20 MG PO CP24
40.0000 mg | ORAL_CAPSULE | Freq: Every day | ORAL | 0 refills | Status: AC
  Filled 2024-10-21: qty 60, 30d supply, fill #0

## 2024-10-22 ENCOUNTER — Encounter: Payer: Self-pay | Admitting: Gastroenterology

## 2024-10-22 ENCOUNTER — Ambulatory Visit: Admitting: Gastroenterology

## 2024-10-22 ENCOUNTER — Encounter

## 2024-10-22 VITALS — BP 109/67 | HR 63 | Temp 98.2°F | Resp 15 | Ht 69.0 in | Wt 205.0 lb

## 2024-10-22 DIAGNOSIS — R112 Nausea with vomiting, unspecified: Secondary | ICD-10-CM

## 2024-10-22 DIAGNOSIS — K2100 Gastro-esophageal reflux disease with esophagitis, without bleeding: Secondary | ICD-10-CM

## 2024-10-22 MED ORDER — SODIUM CHLORIDE 0.9 % IV SOLN: 500.0000 mL | INTRAVENOUS | Status: DC

## 2024-10-22 NOTE — Progress Notes (Signed)
 To pacu, VSS. Report to Rn.tb

## 2024-10-22 NOTE — Op Note (Signed)
 [facility] Patient Name: Michael Esparza Procedure Date: 10/22/2024 10:59 AM MRN: [id number] Endoscopist: Glendia E. Esparza , MD, [id number] Age: 29 Referring MD:  Date of Birth: Nov 12, 1995 Gender: Male Account #: [id number] Procedure:                Upper GI endoscopy Indications:              Esophageal reflux symptoms that persist despite                            appropriate therapy, Preoperative assessment Medicines:                Monitored Anesthesia Care Procedure:                Pre-Anesthesia Assessment:                           - Prior to the procedure, a History and Physical                            was performed, and patient medications and                            allergies were reviewed. The patient's tolerance of                            previous anesthesia was also reviewed. The risks                            and benefits of the procedure and the sedation                            options and risks were discussed with the patient.                            All questions were answered, and informed consent                            was obtained. Prior Anticoagulants: The patient has                            taken no anticoagulant or antiplatelet agents. ASA                            Grade Assessment: II - A patient with mild systemic                            disease. After reviewing the risks and benefits,                            the patient was deemed in satisfactory condition to                            undergo the procedure.  After obtaining informed consent, the endoscope was                            passed under direct vision. Throughout the                            procedure, the patient's blood pressure, pulse, and                            oxygen saturations were monitored continuously. The                            GIF [id number] #[id number] was introduced through the                            mouth, and  advanced to the second part of duodenum.                            The upper GI endoscopy was accomplished without                            difficulty. The patient tolerated the procedure                            well. Scope In: Scope Out: Findings:                 The examined portions of the nasopharynx,                            oropharynx and larynx were normal.                           LA Grade B (one or more mucosal breaks greater than                            5 mm, not extending between the tops of two mucosal                            folds) esophagitis with no bleeding was found.                           A 4 cm hiatal hernia was present.                           The exam of the stomach was otherwise normal.                           The examined duodenum was normal.                           The BRAVO capsule with delivery system was                            introduced through the  mouth and advanced into the                            esophagus, such that the BRAVO pH capsule was                            positioned 31 cm from the incisors, which was 6 cm                            proximal to the GE junction. The BRAVO pH capsule                            was then deployed and attached to the esophageal                            mucosa. The delivery system was then withdrawn.                            Endoscopy was utilized for placement of the probe                            only. The scope was reinserted to evaluate                            placement of the BRAVO capsule. Visualization                            showed the BRAVO capsule to be in an appropriate                            position. Complications:            No immediate complications. Estimated Blood Loss:     Estimated blood loss: none. Impression:               - The examined portions of the nasopharynx,                            oropharynx and larynx were normal.                            - LA Grade B reflux esophagitis with no bleeding.                           - 4 cm hiatal hernia.                           - Normal examined duodenum.                           - The BRAVO pH capsule was deployed.                           - No specimens collected. Recommendation:           -  Patient has a contact number available for                            emergencies. The signs and symptoms of potential                            delayed complications were discussed with the                            patient. Return to normal activities tomorrow.                            Written discharge instructions were provided to the                            patient.                           - Follow printed diet/medication instructions for                            Bravo probe study.                           - Await results                           - Proceed with esophageal manometry Michael Cherubin E. Stacia, MD 10/22/2024 11:36:13 AM This report has been signed electronically.

## 2024-10-22 NOTE — Progress Notes (Signed)
 Called to room to assist during endoscopic procedure.  Patient ID and intended procedure confirmed with present staff. Received instructions for my participation in the procedure from the performing physician.   Capsule expiration date- 10/12/2025  Capsule ID number [id number]  LES measurement: 37 (capsule placed 6 cm above LES) 31  Time of implant: 1125 am

## 2024-10-22 NOTE — Patient Instructions (Signed)
 Post-op Bravo pH instructions Once you get home:  Eat normally and go about your daily routine/activities Limit drinking fluids or eating between meals Do not chew gum or eat hard candy DO NOT take any antacid or anti-reflux medications during the 48-hour monitoring time, unless instructed by your physician  Recording events: Events to be recorded are:  Record using event buttons on recorder and write on paper diary form Every time you eat or drink something (other than water) 2.   Periods of lying down/reclining 3.  Symptoms:  may include heartburn, regurgitation, chest pain, cough or specify if other.  A paper diary is also provided to record the times of your reflux symptoms and times for meals and when you lie down.  The recorder needs to remain within 3 feet (arms length) of you during the testing period (48 hours). If you should forget and move outside of a 3-foot radius of the receiver you may hear beeping and you will see a C1 error in the display window on the top of the receiver.  Please pick up the receiver and hold close to you to re-establish the connection and the error message disappears.  You may take a bath/shower during the testing period, but the recorder must not get wet and must remain within 3 feet of you. Please leave the receiver outside of the shower or tub while bathing. The monitoring period will be for 48 hours after placement of the capsule.  At the end of the 48 hours, you will return the recorder, and your diary, to our [facility] front desk.  A nurse will meet you to collect the device and answer any questions you may have.  The device should turn off once the 48 hours is complete.   What to expect after placement of the capsule:  Some patients experience a vague sensation that something is in their esophagus or that they feel the capsule when they swallow food.  Should you experience this, chewing food carefully or drinking liquids may  minimize this sensation.   After the test is complete, the disposable capsule will fall off the wall of your esophagus within 5-10 days and pass naturally with your bowel movement through the digestive tract.  Once the recorder is returned, your provider will review and interpret your recordings and contact you to discuss your results.  This may take up to two weeks.   DO NOT have an MRI for 30 days after your procedure to ensure the capsule is no longer inside your body  It is imperative that you return the recorder on 10/24/24 by 3:00pm.  Your information must be downloaded at this time to obtain your results.        YOU HAD AN ENDOSCOPIC PROCEDURE TODAY AT THE [facility]:   Refer to the procedure report that was given to you for any specific questions about what was found during the examination.  If the procedure report does not answer your questions, please call your gastroenterologist to clarify.  If you requested that your care partner not be given the details of your procedure findings, then the procedure report has been included in a sealed envelope for you to review at your convenience later.  YOU SHOULD EXPECT: Some feelings of bloating in the abdomen. Passage of more gas than usual.  Walking can help get rid of the air that was put into your GI tract during the procedure and reduce the bloating. If you had a  lower endoscopy (such as a colonoscopy or flexible sigmoidoscopy) you may notice spotting of blood in your stool or on the toilet paper. If you underwent a bowel prep for your procedure, you may not have a normal bowel movement for a few days.  Please Note:  You might notice some irritation and congestion in your nose or some drainage.  This is from the oxygen used during your procedure.  There is no need for concern and it should clear up in a day or so.  SYMPTOMS TO REPORT IMMEDIATELY:  Following upper endoscopy (EGD)  Vomiting of blood or coffee ground  material  New chest pain or pain under the shoulder blades  Painful or persistently difficult swallowing  New shortness of breath  Fever of 100F or higher  Black, tarry-looking stools  For urgent or emergent issues, a gastroenterologist can be reached at any hour by calling [phone]. Do not use MyChart messaging for urgent concerns.    DIET:  We do recommend a small meal at first, but then you may proceed to your regular diet.  Drink plenty of fluids but you should avoid alcoholic beverages for 24 hours.  ACTIVITY:  You should plan to take it easy for the rest of today and you should NOT DRIVE or use heavy machinery until tomorrow (because of the sedation medicines used during the test).    FOLLOW UP: Our staff will call the number listed on your records the next business day following your procedure.  We will call around 7:15- 8:00 am to check on you and address any questions or concerns that you may have regarding the information given to you following your procedure. If we do not reach you, we will leave a message.     If any biopsies were taken you will be contacted by phone or by letter within the next 1-3 weeks.  Please call us  at [phone] if you have not heard about the biopsies in 3 weeks.    SIGNATURES/CONFIDENTIALITY: You and/or your care partner have signed paperwork which will be entered into your electronic medical record.  These signatures attest to the fact that that the information above on your After Visit Summary has been reviewed and is understood.  Full responsibility of the confidentiality of this discharge information lies with you and/or your care-partner.

## 2024-10-22 NOTE — Progress Notes (Signed)
 [facility] History and Physical   Primary Care Physician:  Allwardt, Mardy HERO, PA-C   Reason for Procedure:   Refractory GERD symptoms  Plan:    EGD with Bravo probe placement.   HPI: Michael Esparza is a 29 y.o. male undergoing EGD with Bravo probe placement as part of evaluation for possible hiatal hernia repair/fundoplication.    The patient was provided an opportunity to ask questions and all were answered. The patient agreed with the plan   Past Medical History:  Diagnosis Date   ADHD (attention deficit hyperactivity disorder)    Anxiety    Asperger's syndrome    Disc degeneration, lumbar    accident during work (tree cutting)   Fracture of left hand 10/03/2012   laceration, hedge clipping   GERD (gastroesophageal reflux disease)    High-functioning autism spectrum disorder    Post concussion syndrome 07/21/2014    Past Surgical History:  Procedure Laterality Date   FRACTURE SURGERY     OPEN REDUCTION INTERNAL FIXATION (ORIF) HAND Left 12/21/2015   Procedure: OPEN REDUCTION INTERNAL FIXATION (ORIF) HAND, NERVE AND TENDON REPAIR;  Surgeon: Alm Hummer, MD;  Location: [facility] OR;  Service: Orthopedics;  Laterality: Left;    Prior to Admission medications  Medication Sig Start Date End Date Taking? Authorizing Provider  amphetamine -dextroamphetamine  (ADDERALL  XR) 20 MG 24 hr capsule Take 2 capsules (40 mg total) by mouth daily. 10/21/24  Yes   dicyclomine  (BENTYL ) 10 MG capsule Take 1 capsule (10 mg total) by mouth 4 (four) times daily -  before meals and at bedtime for 10 days. Take for diarrhea and abdominal cramping. 03/18/24 10/22/24 Yes Allwardt, Alyssa M, PA-C  esomeprazole  (NEXIUM ) 40 MG capsule TAKE 1 CAPSULE (40 MG TOTAL) BY MOUTH DAILY. DECREASE TO ONCE DAILY DOSING 08/21/24  Yes Stacia Glendia BRAVO, MD  albuterol (VENTOLIN HFA) 108 (90 Base) MCG/ACT inhaler Inhale 2 puffs into the lungs every 6 (six) hours as needed for shortness of breath or wheezing.  01/04/24 01/03/25  [provider]  amphetamine -dextroamphetamine  (ADDERALL  XR) 20 MG 24 hr capsule Take 1 capsule (20 mg total) by mouth daily. 12/25/23 03/29/24  Allwardt, Alyssa M, PA-C  amphetamine -dextroamphetamine  (ADDERALL  XR) 30 MG 24 hr capsule Take 2 capsules (60 mg total) by mouth daily. 10/14/24   Hepler, Oneil, PA-C  hydrOXYzine  (ATARAX ) 25 MG tablet Take 1 tablet (25 mg total) by mouth 2 (two) times daily. 10/14/24     levocetirizine (XYZAL) 5 MG tablet Take 5 mg by mouth every evening. 01/04/24 01/03/25  [provider]  ondansetron  (ZOFRAN ) 4 MG tablet Take 1 tablet (4 mg total) by mouth every 4 (four) hours as needed for nausea or vomiting. 09/19/24   Stacia Glendia BRAVO, MD  NAPOLEON INHUB 250-50 MCG/ACT AEPB Inhale 1 puff into the lungs in the morning and at bedtime. 05/09/23   [provider]    Current Outpatient Medications  Medication Sig Dispense Refill   amphetamine -dextroamphetamine  (ADDERALL  XR) 20 MG 24 hr capsule Take 2 capsules (40 mg total) by mouth daily. 60 capsule 0   dicyclomine  (BENTYL ) 10 MG capsule Take 1 capsule (10 mg total) by mouth 4 (four) times daily -  before meals and at bedtime for 10 days. Take for diarrhea and abdominal cramping. 40 capsule 0   esomeprazole  (NEXIUM ) 40 MG capsule TAKE 1 CAPSULE (40 MG TOTAL) BY MOUTH DAILY. DECREASE TO ONCE DAILY DOSING 90 capsule 1   albuterol (VENTOLIN HFA) 108 (90 Base) MCG/ACT inhaler Inhale 2 puffs  into the lungs every 6 (six) hours as needed for shortness of breath or wheezing.     amphetamine -dextroamphetamine  (ADDERALL  XR) 20 MG 24 hr capsule Take 1 capsule (20 mg total) by mouth daily. 30 capsule 0   amphetamine -dextroamphetamine  (ADDERALL  XR) 30 MG 24 hr capsule Take 2 capsules (60 mg total) by mouth daily. 60 capsule 0   hydrOXYzine  (ATARAX ) 25 MG tablet Take 1 tablet (25 mg total) by mouth 2 (two) times daily. 60 tablet 0   levocetirizine (XYZAL) 5 MG tablet Take 5 mg by mouth every evening.      ondansetron  (ZOFRAN ) 4 MG tablet Take 1 tablet (4 mg total) by mouth every 4 (four) hours as needed for nausea or vomiting. 30 tablet 1   WIXELA INHUB 250-50 MCG/ACT AEPB Inhale 1 puff into the lungs in the morning and at bedtime.     Current Facility-Administered Medications  Medication Dose Route Frequency Provider Last Rate Last Admin   0.9 %  sodium chloride  infusion  500 mL Intravenous Continuous Stacia Glendia BRAVO, MD        Allergies as of 10/22/2024   (No Known Allergies)    Family History  Problem Relation Age of Onset   Hypertension Mother    Arthritis Father 84       anklosing spondylosis   Ankylosing spondylitis Father    Bipolar disorder Sister    ADD / ADHD Brother    Hyperlipidemia Maternal Grandmother    Parkinson's disease Maternal Grandmother    Diabetes Maternal Grandfather    Cancer Paternal Grandfather    Esophageal cancer Neg Hx    Colon cancer Neg Hx    Rectal cancer Neg Hx    Stomach cancer Neg Hx     Social History   Socioeconomic History   Marital status: Significant Other    Spouse name: Not on file   Number of children: Not on file   Years of education: Not on file   Highest education level: 12th grade  Occupational History   Not on file  Tobacco Use   Smoking status: Never   Smokeless tobacco: Never  Vaping Use   Vaping status: Never Used  Substance and Sexual Activity   Alcohol use: Not Currently   Drug use: Never   Sexual activity: Yes    Birth control/protection: Condom  Other Topics Concern   Not on file  Social History Narrative   Not on file   Social Drivers of Health   Tobacco Use: Low Risk (10/22/2024)   Patient History    Smoking Tobacco Use: Never    Smokeless Tobacco Use: Never    Passive Exposure: Not on file  Financial Resource Strain: Low Risk (12/24/2023)   Overall Financial Resource Strain (CARDIA)    Difficulty of Paying Living Expenses: Not hard at all  Food Insecurity: No Food Insecurity (12/24/2023)    Hunger Vital Sign    Worried About Running Out of Food in the Last Year: Never true    Ran Out of Food in the Last Year: Never true  Transportation Needs: No Transportation Needs (12/24/2023)   PRAPARE - Administrator, Civil Service (Medical): No    Lack of Transportation (Non-Medical): No  Physical Activity: Unknown (12/24/2023)   Exercise Vital Sign    Days of Exercise per Week: 0 days    Minutes of Exercise per Session: Not on file  Stress: No Stress Concern Present (12/24/2023)   [employer] of Occupational Health - Occupational  Stress Questionnaire    Feeling of Stress : Not at all  Social Connections: Moderately Integrated (12/24/2023)   Social Connection and Isolation Panel    Frequency of Communication with Friends and Family: More than three times a week    Frequency of Social Gatherings with Friends and Family: Once a week    Attends Religious Services: More than 4 times per year    Active Member of [employer] or Organizations: No    Attends Engineer, Structural: Not on file    Marital Status: Living with partner  Intimate Partner Violence: Not At Risk (04/22/2024)   Received from [facility]   HITS    Over the last 12 months how often did your partner physically hurt you?: Never    Over the last 12 months how often did your partner insult you or talk down to you?: Never    Over the last 12 months how often did your partner threaten you with physical harm?: Never    Over the last 12 months how often did your partner scream or curse at you?: Never  Depression (PHQ2-9): Low Risk (03/11/2024)   Depression (PHQ2-9)    PHQ-2 Score: 2  Alcohol Screen: Not on file  Housing: Unknown (09/13/2024)   Received [facility]   Epic    Unable to Pay for Housing in the Last Year: Not on file    Number of Times Moved in the Last Year: Not on file    At any time in the past 12 months, were you homeless or living in a shelter (including now)?: No   Utilities: Not on file  Health Literacy: Not on file    Review of Systems:  All other review of systems negative except as mentioned in the HPI.  Physical Exam: Vital signs BP (!) 139/90   Pulse 65   Temp 98.2 F (36.8 C) (Temporal)   Ht 5' 9 (1.753 m)   Wt 205 lb (93 kg)   SpO2 98%   BMI 30.27 kg/m   General:   Alert,  Well-developed, well-nourished, pleasant and cooperative in NAD Airway:  Mallampati 2 Lungs:  Clear throughout to auscultation.   Heart:  Regular rate and rhythm; no murmurs, clicks, rubs,  or gallops. Abdomen:  Soft, nontender and nondistended. Normal bowel sounds.   Neuro/Psych:  Normal mood and affect. A and O x 3   Shatha Hooser E. Stacia, MD [facility]

## 2024-10-23 ENCOUNTER — Telehealth: Payer: Self-pay

## 2024-10-23 NOTE — Telephone Encounter (Signed)
" °  Follow up Call-     10/22/2024    9:24 AM 03/29/2024   11:21 AM  Call back number  Post procedure Call Back phone  # [phone]  Permission to leave phone message Yes Yes     Patient questions:  Do you have a fever, pain , or abdominal swelling? No. Pain Score  0 *  Have you tolerated food without any problems? Yes.    Have you been able to return to your normal activities? Yes.    Do you have any questions about your discharge instructions: Diet   No. Medications  No. Follow up visit  No.  Do you have questions or concerns about your Care? No.  Actions: * If pain score is 4 or above: No action needed, pain <4.   "

## 2024-10-31 ENCOUNTER — Inpatient Hospital Stay: Admission: RE | Admit: 2024-10-31 | Source: Ambulatory Visit

## 2024-11-06 ENCOUNTER — Telehealth: Payer: Self-pay | Admitting: Gastroenterology

## 2024-11-06 NOTE — Telephone Encounter (Signed)
 Patient's Bravo probe study showed elevated esophageal acid exposure, with DeMeester score of 30.3.  Esophageal exposure was increased in both upright and supine positions.    Symptom association probability was low at 65 for dysphagia and regurgitation.  Recommend patient follow-up with Dr. Tanda for further consideration of fundoplication.

## 2024-11-07 ENCOUNTER — Encounter: Payer: Self-pay | Admitting: Gastroenterology
